# Patient Record
Sex: Female | Born: 1940 | Smoking: Never smoker
Health system: Southern US, Community
[De-identification: ages and names within clinical notes are randomized; demographics above are authoritative.]

## PROBLEM LIST (undated history)

## (undated) DIAGNOSIS — F329 Major depressive disorder, single episode, unspecified: Secondary | ICD-10-CM

## (undated) DIAGNOSIS — F32A Depression, unspecified: Secondary | ICD-10-CM

## (undated) DIAGNOSIS — K219 Gastro-esophageal reflux disease without esophagitis: Secondary | ICD-10-CM

---

## 2013-11-11 DIAGNOSIS — I10 Essential (primary) hypertension: Secondary | ICD-10-CM | POA: Insufficient documentation

## 2013-11-11 DIAGNOSIS — K209 Esophagitis, unspecified without bleeding: Secondary | ICD-10-CM

## 2017-01-05 DIAGNOSIS — I10 Essential (primary) hypertension: Secondary | ICD-10-CM | POA: Diagnosis not present

## 2017-01-05 DIAGNOSIS — F419 Anxiety disorder, unspecified: Secondary | ICD-10-CM | POA: Diagnosis not present

## 2017-01-05 DIAGNOSIS — J309 Allergic rhinitis, unspecified: Secondary | ICD-10-CM | POA: Diagnosis not present

## 2017-01-05 DIAGNOSIS — K219 Gastro-esophageal reflux disease without esophagitis: Secondary | ICD-10-CM | POA: Diagnosis not present

## 2017-01-05 DIAGNOSIS — E785 Hyperlipidemia, unspecified: Secondary | ICD-10-CM | POA: Diagnosis not present

## 2017-05-06 ENCOUNTER — Observation Stay (HOSPITAL_COMMUNITY)
Admission: EM | Admit: 2017-05-06 | Discharge: 2017-05-07 | Disposition: A | Discharge status: Home / Self Care | Payer: Medicare HMO | Attending: Internal Medicine | Admitting: Internal Medicine

## 2017-05-06 ENCOUNTER — Other Ambulatory Visit: Payer: Self-pay

## 2017-05-06 ENCOUNTER — Encounter (HOSPITAL_COMMUNITY): Payer: Self-pay | Admitting: Neurology

## 2017-05-06 ENCOUNTER — Inpatient Hospital Stay (HOSPITAL_COMMUNITY): Payer: Medicare HMO

## 2017-05-06 DIAGNOSIS — K219 Gastro-esophageal reflux disease without esophagitis: Secondary | ICD-10-CM

## 2017-05-06 DIAGNOSIS — R1013 Epigastric pain: Secondary | ICD-10-CM | POA: Insufficient documentation

## 2017-05-06 DIAGNOSIS — R0602 Shortness of breath: Secondary | ICD-10-CM | POA: Diagnosis not present

## 2017-05-06 DIAGNOSIS — Z79899 Other long term (current) drug therapy: Secondary | ICD-10-CM | POA: Insufficient documentation

## 2017-05-06 DIAGNOSIS — K922 Gastrointestinal hemorrhage, unspecified: Secondary | ICD-10-CM | POA: Diagnosis present

## 2017-05-06 DIAGNOSIS — K449 Diaphragmatic hernia without obstruction or gangrene: Secondary | ICD-10-CM | POA: Diagnosis not present

## 2017-05-06 DIAGNOSIS — K209 Esophagitis, unspecified without bleeding: Secondary | ICD-10-CM

## 2017-05-06 DIAGNOSIS — D509 Iron deficiency anemia, unspecified: Secondary | ICD-10-CM | POA: Diagnosis not present

## 2017-05-06 DIAGNOSIS — K2210 Ulcer of esophagus without bleeding: Secondary | ICD-10-CM | POA: Insufficient documentation

## 2017-05-06 DIAGNOSIS — K921 Melena: Secondary | ICD-10-CM | POA: Insufficient documentation

## 2017-05-06 DIAGNOSIS — Z23 Encounter for immunization: Secondary | ICD-10-CM | POA: Diagnosis not present

## 2017-05-06 DIAGNOSIS — K210 Gastro-esophageal reflux disease with esophagitis: Secondary | ICD-10-CM | POA: Insufficient documentation

## 2017-05-06 DIAGNOSIS — D649 Anemia, unspecified: Secondary | ICD-10-CM

## 2017-05-06 DIAGNOSIS — R1111 Vomiting without nausea: Secondary | ICD-10-CM | POA: Diagnosis not present

## 2017-05-06 DIAGNOSIS — R112 Nausea with vomiting, unspecified: Secondary | ICD-10-CM | POA: Diagnosis present

## 2017-05-06 DIAGNOSIS — D500 Iron deficiency anemia secondary to blood loss (chronic): Secondary | ICD-10-CM | POA: Diagnosis not present

## 2017-05-06 DIAGNOSIS — F329 Major depressive disorder, single episode, unspecified: Secondary | ICD-10-CM | POA: Insufficient documentation

## 2017-05-06 DIAGNOSIS — R197 Diarrhea, unspecified: Secondary | ICD-10-CM | POA: Diagnosis not present

## 2017-05-06 DIAGNOSIS — K920 Hematemesis: Principal | ICD-10-CM | POA: Insufficient documentation

## 2017-05-06 DIAGNOSIS — R531 Weakness: Secondary | ICD-10-CM | POA: Diagnosis not present

## 2017-05-06 DIAGNOSIS — R031 Nonspecific low blood-pressure reading: Secondary | ICD-10-CM | POA: Diagnosis not present

## 2017-05-06 DIAGNOSIS — R1110 Vomiting, unspecified: Secondary | ICD-10-CM | POA: Diagnosis not present

## 2017-05-06 HISTORY — DX: Major depressive disorder, single episode, unspecified: F32.9

## 2017-05-06 HISTORY — DX: Depression, unspecified: F32.A

## 2017-05-06 HISTORY — DX: Gastro-esophageal reflux disease without esophagitis: K21.9

## 2017-05-06 LAB — CBC
HCT: 28.5 % — ABNORMAL LOW (ref 36.0–46.0)
HCT: 25.5 % — ABNORMAL LOW (ref 36.0–46.0)
HEMATOCRIT: 15.4 % — AB (ref 36.0–46.0)
HEMATOCRIT: 23.6 % — AB (ref 36.0–46.0)
HEMOGLOBIN: 8.5 g/dL — AB (ref 12.0–15.0)
Hemoglobin: 4.3 g/dL — CL (ref 12.0–15.0)
Hemoglobin: 7.6 g/dL — ABNORMAL LOW (ref 12.0–15.0)
Hemoglobin: 9.3 g/dL — ABNORMAL LOW (ref 12.0–15.0)
MCH: 18.9 pg — ABNORMAL LOW (ref 26.0–34.0)
MCH: 24.8 pg — ABNORMAL LOW (ref 26.0–34.0)
MCH: 25.1 pg — AB (ref 26.0–34.0)
MCH: 25.8 pg — ABNORMAL LOW (ref 26.0–34.0)
MCHC: 27.9 g/dL — AB (ref 30.0–36.0)
MCHC: 32.2 g/dL (ref 30.0–36.0)
MCHC: 32.6 g/dL (ref 30.0–36.0)
MCHC: 33.3 g/dL (ref 30.0–36.0)
MCV: 67.8 fL — AB (ref 78.0–100.0)
MCV: 76.9 fL — ABNORMAL LOW (ref 78.0–100.0)
MCV: 76.8 fL — ABNORMAL LOW (ref 78.0–100.0)
MCV: 77.3 fL — ABNORMAL LOW (ref 78.0–100.0)
PLATELETS: 305 10*3/uL (ref 150–400)
PLATELETS: 193 10*3/uL (ref 150–400)
PLATELETS: 182 10*3/uL (ref 150–400)
PLATELETS: 192 10*3/uL (ref 150–400)
RBC: 2.27 MIL/uL — ABNORMAL LOW (ref 3.87–5.11)
RBC: 3.07 MIL/uL — ABNORMAL LOW (ref 3.87–5.11)
RBC: 3.71 MIL/uL — AB (ref 3.87–5.11)
RBC: 3.3 MIL/uL — AB (ref 3.87–5.11)
RDW: 20.1 % — AB (ref 11.5–15.5)
RDW: 20 % — AB (ref 11.5–15.5)
RDW: 19.4 % — ABNORMAL HIGH (ref 11.5–15.5)
RDW: 19.8 % — ABNORMAL HIGH (ref 11.5–15.5)
WBC: 9.1 10*3/uL (ref 4.0–10.5)
WBC: 6.9 10*3/uL (ref 4.0–10.5)
WBC: 9.4 10*3/uL (ref 4.0–10.5)
WBC: 10.2 10*3/uL (ref 4.0–10.5)

## 2017-05-06 LAB — URINALYSIS, ROUTINE W REFLEX MICROSCOPIC
BILIRUBIN URINE: NEGATIVE
GLUCOSE, UA: NEGATIVE mg/dL
KETONES UR: 5 mg/dL — AB
Leukocytes, UA: NEGATIVE
Nitrite: NEGATIVE
PROTEIN: NEGATIVE mg/dL
Specific Gravity, Urine: 1.02 (ref 1.005–1.030)
Squamous Epithelial / LPF: NONE SEEN
pH: 6 (ref 5.0–8.0)

## 2017-05-06 LAB — COMPREHENSIVE METABOLIC PANEL
ALBUMIN: 3.4 g/dL — AB (ref 3.5–5.0)
ALT: 17 U/L (ref 14–54)
AST: 28 U/L (ref 15–41)
Alkaline Phosphatase: 73 U/L (ref 38–126)
Anion gap: 13 (ref 5–15)
BILIRUBIN TOTAL: 0.7 mg/dL (ref 0.3–1.2)
BUN: 47 mg/dL — ABNORMAL HIGH (ref 6–20)
CHLORIDE: 110 mmol/L (ref 101–111)
CO2: 15 mmol/L — ABNORMAL LOW (ref 22–32)
CREATININE: 1.02 mg/dL — AB (ref 0.44–1.00)
Calcium: 8.7 mg/dL — ABNORMAL LOW (ref 8.9–10.3)
GFR calc Af Amer: >60 mL/min (ref >60)
GFR calc non Af Amer: 52 mL/min — ABNORMAL LOW (ref >60)
GLUCOSE: 114 mg/dL — AB (ref 65–99)
POTASSIUM: 4.1 mmol/L (ref 3.5–5.1)
Sodium: 138 mmol/L (ref 135–145)
TOTAL PROTEIN: 5.7 g/dL — AB (ref 6.5–8.1)

## 2017-05-06 LAB — I-STAT TROPONIN, ED: Troponin i, poc: 0.03 ng/mL (ref 0.00–0.08)

## 2017-05-06 LAB — PROTIME-INR
INR: 1.19
Prothrombin Time: 15 seconds (ref 11.4–15.2)

## 2017-05-06 LAB — FERRITIN: FERRITIN: 7 ng/mL — AB (ref 11–307)

## 2017-05-06 LAB — I-STAT CG4 LACTIC ACID, ED
LACTIC ACID, VENOUS: 5.65 mmol/L — AB (ref 0.5–1.9)
LACTIC ACID, VENOUS: 1.52 mmol/L (ref 0.5–1.9)

## 2017-05-06 LAB — PREPARE RBC (CROSSMATCH)

## 2017-05-06 LAB — POC OCCULT BLOOD, ED: Fecal Occult Bld: POSITIVE — AB

## 2017-05-06 LAB — ABO/RH: ABO/RH(D): A POS

## 2017-05-06 LAB — APTT: APTT: 24 s (ref 24–36)

## 2017-05-06 LAB — LIPASE, BLOOD: Lipase: 24 U/L (ref 11–51)

## 2017-05-06 MED ORDER — PANTOPRAZOLE SODIUM 40 MG IV SOLR
8.0000 mg/h | INTRAVENOUS | Status: DC
  Administered 2017-05-06 – 2017-05-07 (×3): 8 mg/h via INTRAVENOUS
  Filled 2017-05-06 (×5): qty 80

## 2017-05-06 MED ORDER — SODIUM CHLORIDE 0.9% FLUSH
3.0000 mL | Freq: Two times a day (BID) | INTRAVENOUS | Status: DC
  Administered 2017-05-06: 3 mL via INTRAVENOUS

## 2017-05-06 MED ORDER — PANTOPRAZOLE SODIUM 40 MG IV SOLR
40.0000 mg | Freq: Once | INTRAVENOUS | Status: AC
  Administered 2017-05-06: 40 mg via INTRAVENOUS
  Filled 2017-05-06: qty 40

## 2017-05-06 MED ORDER — SODIUM CHLORIDE 0.9 % IV SOLN
INTRAVENOUS | Status: DC
  Administered 2017-05-06: 11:00:00 via INTRAVENOUS

## 2017-05-06 MED ORDER — PANTOPRAZOLE SODIUM 40 MG IV SOLR
40.0000 mg | Freq: Two times a day (BID) | INTRAVENOUS | Status: DC
Start: 2017-05-09 — End: 2017-05-07

## 2017-05-06 MED ORDER — VENLAFAXINE HCL ER 37.5 MG PO CP24
37.5000 mg | ORAL_CAPSULE | Freq: Every day | ORAL | Status: DC
  Administered 2017-05-06 – 2017-05-07 (×2): 37.5 mg via ORAL
  Filled 2017-05-06 (×2): qty 1

## 2017-05-06 MED ORDER — SODIUM CHLORIDE 0.9 % IV BOLUS (SEPSIS)
1000.0000 mL | Freq: Once | INTRAVENOUS | Status: DC
  Administered 2017-05-06: 1000 mL via INTRAVENOUS

## 2017-05-06 MED ORDER — INFLUENZA VAC SPLIT HIGH-DOSE 0.5 ML IM SUSY
0.5000 mL | PREFILLED_SYRINGE | INTRAMUSCULAR | Status: AC
  Administered 2017-05-07: 0.5 mL via INTRAMUSCULAR
  Filled 2017-05-06: qty 0.5

## 2017-05-06 MED ORDER — IOPAMIDOL (ISOVUE-300) INJECTION 61%
INTRAVENOUS | Status: AC
Start: 2017-05-06 — End: 2017-05-06
  Administered 2017-05-06: 100 mL
  Filled 2017-05-06: qty 100

## 2017-05-06 MED ORDER — SODIUM CHLORIDE 0.9 % IV SOLN: Freq: Once | INTRAVENOUS | Status: DC

## 2017-05-06 NOTE — ED Notes (Signed)
Blood consent obtained in Epic. Signed by her husband.

## 2017-05-06 NOTE — ED Notes (Signed)
Paged admitting for bp. Admitting ok with bp for now, just to monitor pt make sure SP> 190

## 2017-05-06 NOTE — ED Provider Notes (Signed)
[facility] Provider Note   CSN: [id number] Arrival date & time: 05/06/17  0741     History   Chief Complaint Chief Complaint  Patient presents with  . Nausea  . Emesis    HPI Carolyn Elliott is a 76 y.o. female.  HPI Patient has no significant past medical history.  She does report history of GERD.  She denies a prior history of GI bleed.  Patient developed vomiting last night.  She reports one large episode of black brown vomitus.  She reports also having a large episode of diarrheal stool that was black in appearance.  She is currently denying pain but reports that she does get epigastric pain which she attributes to reflux.  Patient denies any prior diagnosed GI history.  She reports she was not having vomiting or diarrhea before yesterday evening.  She reports that she is able to eat without having pain and does not have vomiting after eating.  Reports by this morning however she gotten so weak that she felt like she might pass out.  Patient's husband reports that she frequently has problems with leg cramps and particularly had a lot of pain and leg cramps last night.  She does not get swelling.  On review of systems patient did endorse exertional shortness of breath.  She reports if she climbs stairs or has to lift things she starts to get short of breath, she notes this is been developing for about a month.  No known cardiac or pulmonary history. Past Medical History:  Diagnosis Date  . Depression   . GERD (gastroesophageal reflux disease)     Patient Active Problem List   Diagnosis Date Noted  . Acute upper GI bleed 05/06/2017  . GERD (gastroesophageal reflux disease) 05/06/2017    History reviewed. No pertinent surgical history.  OB History    No data available       Home Medications    Prior to Admission medications   Medication Sig Start Date End Date Taking? Authorizing Provider  fexofenadine (ALLEGRA) 180 MG tablet Take  180 mg daily as needed by mouth for allergies.   Yes [provider]  venlafaxine XR (EFFEXOR-XR) 37.5 MG 24 hr capsule Take 37.5 mg daily by mouth. 04/02/17  Yes [provider]    Family History Family History  Problem Relation Age of Onset  . Hypertension Mother   . Throat cancer Maternal Grandfather   . Stomach cancer Maternal Aunt     Social History Social History   Tobacco Use  . Smoking status: Never Smoker  . Smokeless tobacco: Never Used  Substance Use Topics  . Alcohol use: Yes    Frequency: Never    Comment: Occasional glass of wine  . Drug use: No     Allergies   Patient has no known allergies.   Review of Systems Review of Systems 10 Systems reviewed and are negative for acute change except as noted in the HPI.   Physical Exam Updated Vital Signs BP (!) 152/71   Pulse 85   Temp 98.6 F (37 C)   Resp 20   Ht 5\' 2"  (1.575 m)   Wt 63.5 kg (140 lb)   SpO2 98%   BMI 25.61 kg/m   Physical Exam  Constitutional: She is oriented to person, place, and time. She appears well-developed and well-nourished.  Patient appears pale and ill.  She is alert and appropriate with no respiratory distress.  Aside from hypothermia patient  has normal sinus rhythm on monitor in the 70s and normotensive blood pressure.  HENT:  Head: Normocephalic and atraumatic.  Mouth/Throat: Oropharynx is clear and moist.  Eyes: Conjunctivae and EOM are normal.  Neck: Neck supple.  Cardiovascular: Normal rate and regular rhythm.  No murmur heard. Pulmonary/Chest: Effort normal and breath sounds normal. No respiratory distress.  Abdominal: Soft. She exhibits no distension. There is no tenderness.  Genitourinary:  Genitourinary Comments: Patient has black dried stool around rectal area.  Musculoskeletal: Normal range of motion. She exhibits no edema or tenderness.  No Peripheral edema no calf tenderness.  Neurological: She is alert and oriented to person, place, and  time. No cranial nerve deficit. She exhibits normal muscle tone. Coordination normal.  Skin: Skin is warm and dry.  Psychiatric: She has a normal mood and affect.  Nursing note and vitals reviewed.    ED Treatments / Results  Labs (all labs ordered are listed, but only abnormal results are displayed) Labs Reviewed  COMPREHENSIVE METABOLIC PANEL - Abnormal; Notable for the following components:      Result Value   CO2 15 (*)    Glucose, Bld 114 (*)    BUN 47 (*)    Creatinine, Ser 1.02 (*)    Calcium 8.7 (*)    Total Protein 5.7 (*)    Albumin 3.4 (*)    GFR calc non Af Amer 52 (*)    All other components within normal limits  CBC - Abnormal; Notable for the following components:   RBC 2.27 (*)    Hemoglobin 4.3 (*)    HCT 15.4 (*)    MCV 67.8 (*)    MCH 18.9 (*)    MCHC 27.9 (*)    RDW 20.1 (*)    All other components within normal limits  I-STAT CG4 LACTIC ACID, ED - Abnormal; Notable for the following components:   Lactic Acid, Venous 5.65 (*)    All other components within normal limits  POC OCCULT BLOOD, ED - Abnormal; Notable for the following components:   Fecal Occult Bld POSITIVE (*)    All other components within normal limits  CULTURE, BLOOD (ROUTINE X 2)  CULTURE, BLOOD (ROUTINE X 2)  LIPASE, BLOOD  PROTIME-INR  APTT  URINALYSIS, ROUTINE W REFLEX MICROSCOPIC  CBC  CBC  CBC  FERRITIN  I-STAT TROPONIN, ED  I-STAT CG4 LACTIC ACID, ED  TYPE AND SCREEN  PREPARE RBC (CROSSMATCH)  ABO/RH    EKG  EKG Interpretation  Date/Time:  Wednesday May 06 2017 08:26:10 EST Ventricular Rate:  71 PR Interval:    QRS Duration: 97 QT Interval:  424 QTC Calculation: 461 R Axis:   63 Text Interpretation:  Sinus rhythm Nonspecific repol abnormality, diffuse leads agree. no STEMI. NO OLD COMPARISON Confirmed by Arby BarrettePfeiffer, Jaziyah Gradel [phone] on 05/06/2017 9:08:15 AM       Radiology No results found.  Procedures Procedures (including critical care time) CRITICAL  CARE Performed by: Arby BarrettePfeiffer, Kayci [place]   Total critical care time:30 minutes  Critical care time was exclusive of separately billable procedures and treating other patients.  Critical care was necessary to treat or prevent imminent or life-threatening deterioration.  Critical care was time spent personally by me on the following activities: development of treatment plan with patient and/or surrogate as well as nursing, discussions with consultants, evaluation of patient's response to treatment, examination of patient, obtaining history from patient or surrogate, ordering and performing treatments and interventions, ordering and review of laboratory studies, ordering and  review of radiographic studies, pulse oximetry and re-evaluation of patient's condition. Medications Ordered in ED Medications  0.9 %  sodium chloride infusion (not administered)  iopamidol (ISOVUE-300) 61 % injection (not administered)  pantoprazole (PROTONIX) 80 mg in sodium chloride 0.9 % 250 mL (0.32 mg/mL) infusion (8 mg/hr Intravenous New Bag/Given 05/06/17 1113)  pantoprazole (PROTONIX) injection 40 mg (not administered)  sodium chloride flush (NS) 0.9 % injection 3 mL (3 mLs Intravenous Not Given 05/06/17 1117)  0.9 %  sodium chloride infusion ( Intravenous New Bag/Given 05/06/17 1108)  venlafaxine XR (EFFEXOR-XR) 24 hr capsule 37.5 mg (not administered)  pantoprazole (PROTONIX) injection 40 mg (40 mg Intravenous Given 05/06/17 0841)  pantoprazole (PROTONIX) injection 40 mg (40 mg Intravenous Given 05/06/17 1004)     Initial Impression / Assessment and Plan / ED Course  I have reviewed the triage vital signs and the nursing notes.  Pertinent labs & imaging results that were available during my care of the patient were reviewed by me and considered in my medical decision making (see chart for details).  Clinical Course as of May 06 1226  Wed May 06, 2017  1610 Consult reviewed with internal medicine teaching service.  We  will seen in the emergency department for admission.  [MP]  1032 Consult: Reviewed with Dr. Nancy Marus of family practice.  She will schedule the patient for recheck tomorrow.  We will have the patient use her home oxygen regularly, continue her scheduled duo nebs  [MP]    Clinical Course User Index [MP] Arby Barrette, MD      Final Clinical Impressions(s) / ED Diagnoses   Final diagnoses:  Upper GI bleed  Symptomatic anemia   Patient presents as outlined above with near syncope and both emesis and bowel movement with dark bloody material.  Hemoglobin found to be 4.3.  2 units of blood transfused rapidly.  Patient tolerated well.  Consultation placed to gastroenterology for GI bleed as source of anemia.  Medical admission ED Discharge Orders    None       Arby Barrette, MD 05/06/17 1229

## 2017-05-06 NOTE — ED Notes (Signed)
Temp increased after 1 L heated blood, being on bear hugger. Removed and warm blankets applied.

## 2017-05-06 NOTE — ED Notes (Addendum)
Dr. Donnald GarrePfeiffer reports to wait on CT scan until patient receives blood. 1st unit is infusing. Admitting MD at bedside.

## 2017-05-06 NOTE — H&P (View-Only) (Signed)
Referring Provider:  Dr. Pfeiffer Primary Care Physician:  Patient, No Pcp Per Primary Gastroenterologist:  Unassigned  Reason for Consultation:  GI bleed/coffee-ground emesis  HPI: Carolyn Elliott is a 76 y.o. female with past medical history of GERD presented to the ED with coffee-ground emesis. She was found to have a hemoglobin of 4.3 on initial evaluation. Normal LFTs. GI is consulted for further evaluation.  Patient seen and examined while in the ED. Husband at bedside. She was noticing intermittent black stool for last 1 month which she thought could be from stomach virus. She also started noticing weakness and fatigue in last few weeks. Yesterday she had 1 episode of coffee-ground emesis and decided to come to the hospital. She is also complaining of mild epigastric discomfort which she attributed to her acid reflux. Denied any NSAID use. Denied any bright red blood per rectum. Last bowel movement was yesterday.  Family history of esophageal cancer in brother at age 59. No previous EGD or colonoscopy.  Past Medical History:  Diagnosis Date  . Depression   . GERD (gastroesophageal reflux disease)     History reviewed. No pertinent surgical history.  Prior to Admission medications   Medication Sig Start Date End Date Taking? Authorizing Provider  venlafaxine XR (EFFEXOR-XR) 37.5 MG 24 hr capsule Take 37.5 mg daily by mouth. 04/02/17   [provider]    Scheduled Meds: . iopamidol      . [START ON 05/09/2017] pantoprazole  40 mg Intravenous Q12H  . sodium chloride flush  3 mL Intravenous Q12H   Continuous Infusions: . sodium chloride    . pantoprozole (PROTONIX) infusion     PRN Meds:.  Allergies as of 05/06/2017  . (No Known Allergies)    Family History  Problem Relation Age of Onset  . Hypertension Mother   . Throat cancer Maternal Grandfather   . Stomach cancer Maternal Aunt     Social History   Socioeconomic History  . Marital status: Unknown     Spouse name: Not on file  . Number of children: Not on file  . Years of education: Not on file  . Highest education level: Not on file  Social Needs  . Financial resource strain: Not on file  . Food insecurity - worry: Not on file  . Food insecurity - inability: Not on file  . Transportation needs - medical: Not on file  . Transportation needs - non-medical: Not on file  Occupational History  . Not on file  Tobacco Use  . Smoking status: Never Smoker  . Smokeless tobacco: Never Used  Substance and Sexual Activity  . Alcohol use: Yes    Frequency: Never    Comment: Occasional glass of wine  . Drug use: No  . Sexual activity: Not on file  Other Topics Concern  . Not on file  Social History Narrative  . Not on file    Review of Systems: Review of Systems  Constitutional: Positive for malaise/fatigue. Negative for chills and fever.  HENT: Negative for hearing loss and tinnitus.   Eyes: Negative for blurred vision and double vision.  Respiratory: Positive for shortness of breath. Negative for cough and hemoptysis.   Cardiovascular: Negative for chest pain and palpitations.  Gastrointestinal: Positive for abdominal pain, heartburn, melena, nausea and vomiting.  Genitourinary: Negative for dysuria and urgency.  Musculoskeletal: Negative for myalgias and neck pain.  Skin: Negative for itching and rash.  Neurological: Positive for weakness. Negative for focal weakness and seizures.    Endo/Heme/Allergies: Does not bruise/bleed easily.  Psychiatric/Behavioral: Negative for hallucinations and substance abuse.    Physical Exam: Vital signs: Vitals:   05/06/17 0936 05/06/17 0956  BP: (!) 141/62 135/67  Pulse: 88 84  Resp: (!) 22 18  Temp: 97.8 F (36.6 C) 97.9 F (36.6 C)  SpO2: 100% 100%     Physical Exam  Constitutional: She is oriented to person, place, and time. She appears well-developed and well-nourished. No distress.  HENT:  Head: Normocephalic and atraumatic.   Mouth/Throat: Oropharynx is clear and moist. No oropharyngeal exudate.  Eyes: EOM are normal. Right eye exhibits no discharge. No scleral icterus.  Neck: Normal range of motion. Neck supple. No thyromegaly present.  Cardiovascular: Normal rate, regular rhythm and normal heart sounds.  Pulmonary/Chest: Effort normal and breath sounds normal. No respiratory distress.  Abdominal: Soft. Bowel sounds are normal. She exhibits no distension and no mass. There is no tenderness. There is no guarding.  Musculoskeletal: Normal range of motion. She exhibits no edema.  Neurological: She is alert and oriented to person, place, and time.  Skin: Skin is warm. No erythema.  Psychiatric: She has a normal mood and affect. Her behavior is normal. Judgment normal.  Nursing note and vitals reviewed.   GI:  Lab Results: Recent Labs    05/06/17 0800  WBC 9.1  HGB 4.3*  HCT 15.4*  PLT 305   BMET Recent Labs    05/06/17 0800  NA 138  K 4.1  CL 110  CO2 15*  GLUCOSE 114*  BUN 47*  CREATININE 1.02*  CALCIUM 8.7*   LFT Recent Labs    05/06/17 0800  PROT 5.7*  ALBUMIN 3.4*  AST 28  ALT 17  ALKPHOS 73  BILITOT 0.7   PT/INR No results for input(s): LABPROT, INR in the last 72 hours.   Studies/Results: No results found.  Impression/Plan: - Coffee-ground emesis. - Melena. Intermittent for last 1 month. - Acute blood loss anemia with hemoglobin of 4.3 on admission. - Chronic GERD - Family history of esophageal cancer in brother at age for 4259  Recommendations -------------------------- - Transfuse to keep hemoglobin around 7-8 - Continue Protonix drip for now. - Follow CT abdomen pelvis which is ordered by ER physician  - Plan for EGD tomorrow depending on patient's hemoglobin level.  Risks (bleeding, infection, bowel perforation that could require surgery, sedation-related changes in cardiopulmonary systems), benefits (identification and possible treatment of source of symptoms,  exclusion of certain causes of symptoms), and alternatives (watchful waiting, radiographic imaging studies, empiric medical treatment)  were explained to patient and husband in detail and patient wishes to proceed.  - Okay to have full liquid diet from GI standpoint after CT scan. Nothing by mouth past midnight.   LOS: 0 days   Kathi DerParag Shaquon Gropp  MD, FACP 05/06/2017, 10:08 AM  Contact #  [phone]

## 2017-05-06 NOTE — Consult Note (Signed)
Referring Provider:  Dr. Donnald GarrePfeiffer Primary Care Physician:  Patient, No Pcp Per Primary Gastroenterologist:  Gentry FitzUnassigned  Reason for Consultation:  GI bleed/coffee-ground emesis  HPI: Carolyn KayserMartha J Stockman is a 76 y.o. female with past medical history of GERD presented to the ED with coffee-ground emesis. She was found to have a hemoglobin of 4.3 on initial evaluation. Normal LFTs. GI is consulted for further evaluation.  Patient seen and examined while in the ED. Husband at bedside. She was noticing intermittent black stool for last 1 month which she thought could be from stomach virus. She also started noticing weakness and fatigue in last few weeks. Yesterday she had 1 episode of coffee-ground emesis and decided to come to the hospital. She is also complaining of mild epigastric discomfort which she attributed to her acid reflux. Denied any NSAID use. Denied any bright red blood per rectum. Last bowel movement was yesterday.  Family history of esophageal cancer in brother at age 75. No previous EGD or colonoscopy.  Past Medical History:  Diagnosis Date  . Depression   . GERD (gastroesophageal reflux disease)     History reviewed. No pertinent surgical history.  Prior to Admission medications   Medication Sig Start Date End Date Taking? Authorizing Provider  venlafaxine XR (EFFEXOR-XR) 37.5 MG 24 hr capsule Take 37.5 mg daily by mouth. 04/02/17   [provider]    Scheduled Meds: . iopamidol      . [START ON 05/09/2017] pantoprazole  40 mg Intravenous Q12H  . sodium chloride flush  3 mL Intravenous Q12H   Continuous Infusions: . sodium chloride    . pantoprozole (PROTONIX) infusion     PRN Meds:.  Allergies as of 05/06/2017  . (No Known Allergies)    Family History  Problem Relation Age of Onset  . Hypertension Mother   . Throat cancer Maternal Grandfather   . Stomach cancer Maternal Aunt     Social History   Socioeconomic History  . Marital status: Unknown     Spouse name: Not on file  . Number of children: Not on file  . Years of education: Not on file  . Highest education level: Not on file  Social Needs  . Financial resource strain: Not on file  . Food insecurity - worry: Not on file  . Food insecurity - inability: Not on file  . Transportation needs - medical: Not on file  . Transportation needs - non-medical: Not on file  Occupational History  . Not on file  Tobacco Use  . Smoking status: Never Smoker  . Smokeless tobacco: Never Used  Substance and Sexual Activity  . Alcohol use: Yes    Frequency: Never    Comment: Occasional glass of wine  . Drug use: No  . Sexual activity: Not on file  Other Topics Concern  . Not on file  Social History Narrative  . Not on file    Review of Systems: Review of Systems  Constitutional: Positive for malaise/fatigue. Negative for chills and fever.  HENT: Negative for hearing loss and tinnitus.   Eyes: Negative for blurred vision and double vision.  Respiratory: Positive for shortness of breath. Negative for cough and hemoptysis.   Cardiovascular: Negative for chest pain and palpitations.  Gastrointestinal: Positive for abdominal pain, heartburn, melena, nausea and vomiting.  Genitourinary: Negative for dysuria and urgency.  Musculoskeletal: Negative for myalgias and neck pain.  Skin: Negative for itching and rash.  Neurological: Positive for weakness. Negative for focal weakness and seizures.  Endo/Heme/Allergies: Does not bruise/bleed easily.  Psychiatric/Behavioral: Negative for hallucinations and substance abuse.    Physical Exam: Vital signs: Vitals:   05/06/17 0936 05/06/17 0956  BP: (!) 141/62 135/67  Pulse: 88 84  Resp: (!) 22 18  Temp: 97.8 F (36.6 C) 97.9 F (36.6 C)  SpO2: 100% 100%     Physical Exam  Constitutional: She is oriented to person, place, and time. She appears well-developed and well-nourished. No distress.  HENT:  Head: Normocephalic and atraumatic.   Mouth/Throat: Oropharynx is clear and moist. No oropharyngeal exudate.  Eyes: EOM are normal. Right eye exhibits no discharge. No scleral icterus.  Neck: Normal range of motion. Neck supple. No thyromegaly present.  Cardiovascular: Normal rate, regular rhythm and normal heart sounds.  Pulmonary/Chest: Effort normal and breath sounds normal. No respiratory distress.  Abdominal: Soft. Bowel sounds are normal. She exhibits no distension and no mass. There is no tenderness. There is no guarding.  Musculoskeletal: Normal range of motion. She exhibits no edema.  Neurological: She is alert and oriented to person, place, and time.  Skin: Skin is warm. No erythema.  Psychiatric: She has a normal mood and affect. Her behavior is normal. Judgment normal.  Nursing note and vitals reviewed.   GI:  Lab Results: Recent Labs    05/06/17 0800  WBC 9.1  HGB 4.3*  HCT 15.4*  PLT 305   BMET Recent Labs    05/06/17 0800  NA 138  K 4.1  CL 110  CO2 15*  GLUCOSE 114*  BUN 47*  CREATININE 1.02*  CALCIUM 8.7*   LFT Recent Labs    05/06/17 0800  PROT 5.7*  ALBUMIN 3.4*  AST 28  ALT 17  ALKPHOS 73  BILITOT 0.7   PT/INR No results for input(s): LABPROT, INR in the last 72 hours.   Studies/Results: No results found.  Impression/Plan: - Coffee-ground emesis. - Melena. Intermittent for last 1 month. - Acute blood loss anemia with hemoglobin of 4.3 on admission. - Chronic GERD - Family history of esophageal cancer in brother at age for 4259  Recommendations -------------------------- - Transfuse to keep hemoglobin around 7-8 - Continue Protonix drip for now. - Follow CT abdomen pelvis which is ordered by ER physician  - Plan for EGD tomorrow depending on patient's hemoglobin level.  Risks (bleeding, infection, bowel perforation that could require surgery, sedation-related changes in cardiopulmonary systems), benefits (identification and possible treatment of source of symptoms,  exclusion of certain causes of symptoms), and alternatives (watchful waiting, radiographic imaging studies, empiric medical treatment)  were explained to patient and husband in detail and patient wishes to proceed.  - Okay to have full liquid diet from GI standpoint after CT scan. Nothing by mouth past midnight.   LOS: 0 days   Kathi DerParag Ketina Mars  MD, FACP 05/06/2017, 10:08 AM  Contact #  [phone]

## 2017-05-06 NOTE — ED Notes (Signed)
Went to CT at 13:53.

## 2017-05-06 NOTE — ED Triage Notes (Signed)
Per ems-n/v/d x several days. Was sitting on the toilet when EMS arrived, slumped over toilet, weak radials.  114/36 initial BP. Given 300 NS given. Pt is a x 4. CBG 247. IV 20 gauge L. AC.

## 2017-05-06 NOTE — H&P (Signed)
Date: 05/06/2017               Patient Name:  Carolyn KayserMartha J Elliott MRN: [id number]  DOB: 1941/06/07 Age / Sex: 76 y.o., female   PCP: Patient, No Pcp Per         Medical Service: Internal Medicine Teaching Service         Attending Physician: Dr. Rogelia BogaButcher, Austin MilesElizabeth A, MD    First Contact: Dr. Caron PresumeHelberg Pager: [phone]  Second Contact: Dr. Antony ContrasGuilloud Pager: [phone]       After Hours (After 5p/  First Contact Pager: [phone]  weekends / holidays): Second Contact Pager: [phone]   Chief Complaint: Dark stool, weakness, nausea and vomiting  History of Present Illness:  Ms. Edwyna ReadyMartha Elliott is a 76 year old woman with PMH significant for GERD, HTN (no longer on antihypertensives), HLD, and Anxiety/Depression who presents for evaluation of nausea, vomiting, dark stool, and weakness. She is accompanied by her husband.  She reports symptoms of intermittent black colored diarrhea beginning about 1 month ago. She has had associated shortness of breath with exertion and weakness in her legs. Last night, her symptoms worsened and she became nauseous and had an episode of dark black colored emesis with another large dark black diarrheal bowel movement. She felt lightheaded, diaphoretic, and as if she was having hot flashes. She did not lose consciousness, fall, or injure herself. She reports having epigastric pain which feels like her usual reflux/heartburn that worsened as she has not taken her Nexium recently. Her husband thinks she appears more pale than usual since last night. She denies any similar symptoms in the past prior to 1 month ago. She denies any dysphagia or odynophagia. She has not had a colonoscopy. She does not routinely seek medical care, but reports seeing a primary care physician a few months ago with [facility].  She says the only medications she takes are Nexium for GERD, Allegra for pruritus, and Effexor for anxiety/depression. She denies any NSAID use.  In the ED, initial vitals were: BP  126/49   Pulse 84   Temp 94 F (34.4 C) (Rectal)   Resp 20   Ht 5\' 2"  (1.575 m)   Wt 63.5 kg (140 lb)   SpO2 100%  Initial lab work was notable for a Hgb of 4.3 (last value 11.5 in 2016 in Care Everywhere). She was typed and screened and ordered to have PRBC transfusions. She received 1 L NS bolus, and started on a Protonix drip. A warming blanket was placed on her with improvement in her temp to 98.3. GI were consulted for further evaluation and management and IMTS were called for admission.   Social Hx/Family Hx: She denies any history of tobacco use. She drinks an occasional glass of wine and denies any illicit drug use. She reports a history of throat cancer in her maternal grandfather and a history of stomach cancer in her maternal aunt.  Meds:  Current Meds  Medication Sig  . fexofenadine (ALLEGRA) 180 MG tablet Take 180 mg daily as needed by mouth for allergies.  Marland Kitchen. venlafaxine XR (EFFEXOR-XR) 37.5 MG 24 hr capsule Take 37.5 mg daily by mouth.     Allergies: Allergies as of 05/06/2017  . (No Known Allergies)   Past Medical History:  Diagnosis Date  . Depression   . GERD (gastroesophageal reflux disease)     Review of Systems: A complete ROS was negative except as per HPI.   Physical Exam: Blood pressure 138/65, pulse  80, temperature 99 F (37.2 C), resp. rate 18, height 5\' 2"  (1.575 m), weight 140 lb (63.5 kg), SpO2 100 %. Physical Exam  Constitutional: She is oriented to person, place, and time. She appears well-developed. No distress.  Resting in bed under warming blanket, appears pale, not diaphoretic or in acute distress. Answering all questions appropriately.  HENT:  Head: Normocephalic and atraumatic.  Mouth/Throat: No oropharyngeal exudate.  Dry tongue  Eyes:  Slight conjunctival pallor  Neck: Neck supple.  Cardiovascular: Normal rate and regular rhythm.  No murmur heard. Pulmonary/Chest: Effort normal. No stridor. No respiratory distress. She has no  wheezes.  Abdominal: Soft. She exhibits no distension. There is no tenderness. There is no guarding.  Musculoskeletal: She exhibits no edema or tenderness.  Neurological: She is alert and oriented to person, place, and time.  Skin: Skin is warm. Capillary refill takes 2 to 3 seconds. She is not diaphoretic.  Psychiatric: She has a normal mood and affect.     EKG: personally reviewed my interpretation is sinus rhythm, rate 72 bpm, q-waves inferior leads  CXR: not obtained  Assessment & Plan by Problem: Principal Problem:   Acute upper GI bleed Active Problems:   GERD (gastroesophageal reflux disease)  76 year old woman with PMH significant for GERD, HTN (no longer on antihypertensives), HLD, and Anxiety/Depression who presents with symptomatic anemia from suspected upper GI bleed.  Symptomatic Anemia 2/2 Upper GI Bleed: Patient with coffee-ground emesis last night and melena for the last month. She had significant symptomatic anemia on arrival with a Hgb of 4.3 and hypothermia. She responded well to warming blankets and a fluid bolus. She has a longstanding history of GERD without prior EGD or colonoscopy. Will need an upper endoscopy to evaluate for source of her bleeding. Leading differential includes PUD and upper GI malignancy. Doubt Mallory-Weiss as she has only had one episode of emesis and doubt esophageal rupture as she has no subcutaneous crepitus, hamman's sign, and has responded well to initial therapy. CT Abdomen/Pelvis was ordered and GI has been consulted by the ED physician. - Admit to SDU - Transfuse 4 units PRBCs now - Monitor CBC post-transfusion and q4h; Transfuse for Hgb <7.0 - NS @ 100/hr - Continue Protonix gtt for now per GI; can transition to BID IV PPI dosing - Continue warming blanket as needed - f/u CT abd/pelvis - NPO @ MN for EGD tomorrow - Appreciate GI consultation - Strict I/Os  Code Status: DNR/DNI confirmed with patient; otherwise full scope of  practice.    Dispo: Admit patient to Inpatient with expected length of stay greater than 2 midnights.  Signed: Darreld McleanPatel, Javeria Briski, MD 05/06/2017, 11:27 AM

## 2017-05-06 NOTE — Progress Notes (Signed)
Subjective: Patient reports a decrease in symptoms after receiving 4 units of rbcs. Daughter and granddaughter present during interview. Denies any further nausea, vomiting, or bloody diarrhea. Complains of dry mouth. Patient was hoping to go home today but is willing to stay and understands the importance of getting upper endoscopy in the morning. All questions and concerns were addressed.  Objective: Vital signs in last 24 hours: Vitals:   05/06/17 1217 05/06/17 1323 05/06/17 1419 05/06/17 1441  BP: (!) 152/71 (!) 148/59 (!) 151/70 (!) 159/72  Pulse: 85 80 87 83  Resp: 20 18 16    Temp: 98.6 F (37 C) 98.7 F (37.1 C) 99 F (37.2 C) 98.8 F (37.1 C)  TempSrc:  Oral Oral   SpO2:   99%   Weight:      Height:       Weight change:   Intake/Output Summary (Last 24 hours) at 05/06/2017 1532 Last data filed at 05/06/2017 1441 Gross per 24 hour  Intake 1729 ml  Output -  Net 1729 ml   Physical Exam  General: laying comfortably in bed receiving transfusion, no acute distress  HENT Eyes: conjunctival pallor Throat: dry mucus membranes Chest: RRR,normal heart sounds, no JVD, no m/r/g Lungs: CTAB, normal work of breathing, no crackles no wheezing Abd: soft, non tender, non distended Neuro: alert and oriented x4 Skin: skin is warm, no rashes, no edema, 3-4second capillary refill  Lab Results: @LABTEST2 @ Micro Results: No results found for this or any previous visit (from the past 240 hour(s)). Studies/Results: Ct Abdomen Pelvis W Contrast  Result Date: 05/06/2017 CLINICAL DATA:  Vomiting, diarrhea for months. Stool positive, blood transfusion today. EXAM: CT ABDOMEN AND PELVIS WITH CONTRAST TECHNIQUE: Multidetector CT imaging of the abdomen and pelvis was performed using the standard protocol following bolus administration of intravenous contrast. CONTRAST:  100mL ISOVUE-300 IOPAMIDOL (ISOVUE-300) INJECTION 61% COMPARISON:  None. FINDINGS: Lower chest: Lung bases are clear. Large  hiatal hernia, partially imaged. Hepatobiliary: No focal liver abnormality is seen. No gallstones, gallbladder wall thickening, or biliary dilatation. Pancreas: Unremarkable. No pancreatic ductal dilatation or surrounding inflammatory changes. Spleen: Normal in size without focal abnormality. Adrenals/Urinary Tract: Right renal cortex scarring. No suspicious mass or lesion within either kidney. No renal stone or hydronephrosis. No ureteral or bladder calculi identified. Bladder appears normal. Adrenal glands are unremarkable. Stomach/Bowel: Bowel is normal in caliber. No bowel wall thickening or evidence of bowel wall inflammation. Scattered diverticulosis within the descending and sigmoid colon without evidence of acute diverticulitis. Appendix is normal. Vascular/Lymphatic: Aortic atherosclerosis. No enlarged abdominal or pelvic lymph nodes. Reproductive: Uterus and bilateral adnexa are unremarkable. Other: No free fluid or abscess collection. No free intraperitoneal air. Musculoskeletal: Degenerative change within the scoliotic lumbar spine, mild to moderate in degree. No acute or suspicious osseous finding. IMPRESSION: 1. No acute findings. No bowel obstruction or evidence of bowel wall inflammation. No free fluid or hemorrhage. No evidence of acute solid organ abnormality. 2. Colonic diverticulosis without evidence of acute diverticulitis. 3. Large hiatal hernia, incompletely imaged. 4. Aortic atherosclerosis. Electronically Signed   By: Bary RichardStan  Maynard M.D.   On: 05/06/2017 14:20   Medications: I have reviewed the patient's current medications. Scheduled Meds: . [START ON 05/09/2017] pantoprazole  40 mg Intravenous Q12H  . sodium chloride flush  3 mL Intravenous Q12H  . venlafaxine XR  37.5 mg Oral Daily   Continuous Infusions: . sodium chloride    . sodium chloride 100 mL/hr at 05/06/17 1108  . pantoprozole (PROTONIX) infusion  8 mg/hr (05/06/17 1113)   PRN Meds:. Assessment/Plan: Principal  Problem:   Acute upper GI bleed Active Problems:   GERD (gastroesophageal reflux disease)  76 year old woman with a PMHx of GERD, HTN, HLD, anxiety/depression who presents with symptomatic anemia from signs of upper gastointestinal bleeding. Patient is not taking NSAIDs, drinks alcohol only a rare occasion, and denies coughing or vomiting other than 1 episode yesterday. Hematemesis and melena indicate a likely upper GI bleed. PUD vs malignancy highest on my differential with mallory weiss, gastritis, and boerhaave less likely. Has responded well to 4 units of RBCs and warm blankets  Anemia 2/2 suspected upper GI bleeding -repeat CBC post transfusion -f/u CT abd/pelvis -4 units RBCs, keep hgb 7-8 -EGD in the morning -Protonix gtt -full liquid diet, NPO after midnight  This is a Psychologist, occupationalMedical Student Note.  The care of the patient was discussed with Dr. Caron PresumeHelberg and the assessment and plan formulated with their assistance.  Please see their attached note for official documentation of the daily encounter.   LOS: 0 days   Lisette GrinderMcMillian, Alyan Hartline A, Medical Student 05/06/2017, 3:32 PM My pager: [phone]

## 2017-05-06 NOTE — Anesthesia Preprocedure Evaluation (Addendum)
Anesthesia Evaluation  Patient identified by MRN, date of birth, ID band Patient awake    Reviewed: Allergy & Precautions, NPO status , Patient's Chart, lab work & pertinent test results  History of Anesthesia Complications Negative for: history of anesthetic complications  Airway Mallampati: II  TM Distance: >3 FB Neck ROM: Full    Dental no notable dental hx. (+) Dental Advisory Given   Pulmonary neg pulmonary ROS,    Pulmonary exam normal        Cardiovascular negative cardio ROS Normal cardiovascular exam     Neuro/Psych PSYCHIATRIC DISORDERS Depression    GI/Hepatic Neg liver ROS, GERD  Medicated,GI bleed   Endo/Other  negative endocrine ROS  Renal/GU negative Renal ROS     Musculoskeletal   Abdominal   Peds  Hematology  (+) Blood dyscrasia (Hb 9.3), anemia ,   Anesthesia Other Findings   Reproductive/Obstetrics                            Anesthesia Physical Anesthesia Plan  ASA: II  Anesthesia Plan: MAC   Post-op Pain Management:    Induction:   PONV Risk Score and Plan: Treatment may vary due to age or medical condition, Ondansetron and Dexamethasone  Airway Management Planned:   Additional Equipment:   Intra-op Plan:   Post-operative Plan:   Informed Consent: I have reviewed the patients History and Physical, chart, labs and discussed the procedure including the risks, benefits and alternatives for the proposed anesthesia with the patient or authorized representative who has indicated his/her understanding and acceptance.   Dental advisory given  Plan Discussed with: CRNA and Anesthesiologist  Anesthesia Plan Comments:         Anesthesia Quick Evaluation

## 2017-05-07 ENCOUNTER — Encounter (HOSPITAL_COMMUNITY): Payer: Self-pay | Admitting: Anesthesiology

## 2017-05-07 ENCOUNTER — Encounter (HOSPITAL_COMMUNITY)
Admission: EM | Disposition: A | Discharge status: Home / Self Care | Payer: Self-pay | Source: Home / Self Care | Attending: Emergency Medicine

## 2017-05-07 ENCOUNTER — Inpatient Hospital Stay (HOSPITAL_COMMUNITY): Payer: Medicare HMO | Admitting: Anesthesiology

## 2017-05-07 DIAGNOSIS — R1013 Epigastric pain: Secondary | ICD-10-CM | POA: Diagnosis not present

## 2017-05-07 DIAGNOSIS — Z79899 Other long term (current) drug therapy: Secondary | ICD-10-CM | POA: Diagnosis not present

## 2017-05-07 DIAGNOSIS — R531 Weakness: Secondary | ICD-10-CM | POA: Diagnosis not present

## 2017-05-07 DIAGNOSIS — R197 Diarrhea, unspecified: Secondary | ICD-10-CM | POA: Diagnosis not present

## 2017-05-07 DIAGNOSIS — K2211 Ulcer of esophagus with bleeding: Secondary | ICD-10-CM

## 2017-05-07 DIAGNOSIS — Z9889 Other specified postprocedural states: Secondary | ICD-10-CM | POA: Diagnosis not present

## 2017-05-07 DIAGNOSIS — K449 Diaphragmatic hernia without obstruction or gangrene: Secondary | ICD-10-CM | POA: Diagnosis not present

## 2017-05-07 DIAGNOSIS — K2270 Barrett's esophagus without dysplasia: Secondary | ICD-10-CM | POA: Diagnosis not present

## 2017-05-07 DIAGNOSIS — K921 Melena: Secondary | ICD-10-CM | POA: Diagnosis not present

## 2017-05-07 DIAGNOSIS — D649 Anemia, unspecified: Secondary | ICD-10-CM | POA: Diagnosis not present

## 2017-05-07 DIAGNOSIS — R0602 Shortness of breath: Secondary | ICD-10-CM | POA: Diagnosis not present

## 2017-05-07 DIAGNOSIS — K922 Gastrointestinal hemorrhage, unspecified: Secondary | ICD-10-CM | POA: Diagnosis not present

## 2017-05-07 DIAGNOSIS — K920 Hematemesis: Secondary | ICD-10-CM | POA: Diagnosis not present

## 2017-05-07 DIAGNOSIS — Z23 Encounter for immunization: Secondary | ICD-10-CM | POA: Diagnosis not present

## 2017-05-07 HISTORY — PX: ESOPHAGOGASTRODUODENOSCOPY (EGD) WITH PROPOFOL: SHX5813

## 2017-05-07 LAB — TYPE AND SCREEN
ABO/RH(D): A POS
Antibody Screen: NEGATIVE
UNIT DIVISION: 0
UNIT DIVISION: 0
UNIT DIVISION: 0
Unit division: 0

## 2017-05-07 LAB — BPAM RBC
BLOOD PRODUCT EXPIRATION DATE: 201811142359
BLOOD PRODUCT EXPIRATION DATE: 201811192359
Blood Product Expiration Date: 201811142359
Blood Product Expiration Date: 201811302359
ISSUE DATE / TIME: 201811070924
ISSUE DATE / TIME: 201811070924
ISSUE DATE / TIME: 201811071148
ISSUE DATE / TIME: 201811071413
UNIT TYPE AND RH: 6200
UNIT TYPE AND RH: 6200
Unit Type and Rh: 6200
Unit Type and Rh: 6200

## 2017-05-07 LAB — CBC
HCT: 25.8 % — ABNORMAL LOW (ref 36.0–46.0)
HEMATOCRIT: 25.2 % — AB (ref 36.0–46.0)
Hemoglobin: 8.4 g/dL — ABNORMAL LOW (ref 12.0–15.0)
Hemoglobin: 8.6 g/dL — ABNORMAL LOW (ref 12.0–15.0)
MCH: 25.7 pg — AB (ref 26.0–34.0)
MCH: 25.7 pg — ABNORMAL LOW (ref 26.0–34.0)
MCHC: 33.3 g/dL (ref 30.0–36.0)
MCHC: 33.3 g/dL (ref 30.0–36.0)
MCV: 77.1 fL — ABNORMAL LOW (ref 78.0–100.0)
MCV: 77.2 fL — AB (ref 78.0–100.0)
PLATELETS: 193 10*3/uL (ref 150–400)
Platelets: 164 10*3/uL (ref 150–400)
RBC: 3.27 MIL/uL — ABNORMAL LOW (ref 3.87–5.11)
RBC: 3.34 MIL/uL — ABNORMAL LOW (ref 3.87–5.11)
RDW: 19.6 % — ABNORMAL HIGH (ref 11.5–15.5)
RDW: 19.9 % — AB (ref 11.5–15.5)
WBC: 8.4 10*3/uL (ref 4.0–10.5)
WBC: 7.4 10*3/uL (ref 4.0–10.5)

## 2017-05-07 LAB — BASIC METABOLIC PANEL
Anion gap: 4 — ABNORMAL LOW (ref 5–15)
BUN: 22 mg/dL — AB (ref 6–20)
CALCIUM: 8.3 mg/dL — AB (ref 8.9–10.3)
CO2: 19 mmol/L — AB (ref 22–32)
Chloride: 118 mmol/L — ABNORMAL HIGH (ref 101–111)
Creatinine, Ser: 0.85 mg/dL (ref 0.44–1.00)
GFR calc Af Amer: >60 mL/min (ref >60)
GLUCOSE: 87 mg/dL (ref 65–99)
Potassium: 3.5 mmol/L (ref 3.5–5.1)
Sodium: 141 mmol/L (ref 135–145)

## 2017-05-07 LAB — MRSA PCR SCREENING: MRSA BY PCR: NEGATIVE

## 2017-05-07 SURGERY — ESOPHAGOGASTRODUODENOSCOPY (EGD) WITH PROPOFOL
Anesthesia: Monitor Anesthesia Care

## 2017-05-07 MED ORDER — PROPOFOL 500 MG/50ML IV EMUL
INTRAVENOUS | Status: DC | PRN
  Administered 2017-05-07: 75 ug/kg/min via INTRAVENOUS

## 2017-05-07 MED ORDER — ONDANSETRON HCL 4 MG/2ML IJ SOLN
INTRAMUSCULAR | Status: DC | PRN
  Administered 2017-05-07: 4 mg via INTRAVENOUS

## 2017-05-07 MED ORDER — PANTOPRAZOLE SODIUM 40 MG PO TBEC: 40.0000 mg | DELAYED_RELEASE_TABLET | Freq: Two times a day (BID) | ORAL | 1 refills | Status: DC

## 2017-05-07 MED ORDER — BUTAMBEN-TETRACAINE-BENZOCAINE 2-2-14 % EX AERO
INHALATION_SPRAY | CUTANEOUS | Status: DC | PRN
  Administered 2017-05-07: 2 via TOPICAL

## 2017-05-07 MED ORDER — PROPOFOL 10 MG/ML IV BOLUS
INTRAVENOUS | Status: DC | PRN
  Administered 2017-05-07 (×2): 20 mg via INTRAVENOUS

## 2017-05-07 MED ORDER — SODIUM CHLORIDE 0.9 % IV SOLN: INTRAVENOUS | Status: DC

## 2017-05-07 MED ORDER — SODIUM CHLORIDE 0.9 % IV SOLN
INTRAVENOUS | Status: DC | PRN
  Administered 2017-05-07: 11:00:00 via INTRAVENOUS

## 2017-05-07 SURGICAL SUPPLY — 14 items

## 2017-05-07 NOTE — Care Management Obs Status (Deleted)
MEDICARE OBSERVATION STATUS NOTIFICATION   Patient Details  Name: Ezequiel KayserMartha J Wambold MRN: [id number] Date of Birth: [birth year]   Medicare Observation Status Notification Given:       Verdene LennertGoldean, Billye Nydam K, RN 05/07/2017, 2:22 PM

## 2017-05-07 NOTE — Discharge Summary (Signed)
Name: Carolyn Elliott MRN: [id number] DOB: 1941-06-25 76 y.o. PCP: Patient, No Pcp Per  Date of Admission: 05/06/2017  7:41 AM Date of Discharge: 05/07/2017 Attending Physician: Burns SpainButcher, Elizabeth A, MD  Discharge Diagnosis: 1. Upper GI Bleed 2. GERD  Principal Problem:   Acute upper GI bleed Active Problems:   GERD (gastroesophageal reflux disease)  Discharge Medications: Allergies as of 05/07/2017   No Known Allergies     Medication List    TAKE these medications   fexofenadine 180 MG tablet Commonly known as:  ALLEGRA Take 180 mg daily as needed by mouth for allergies.   pantoprazole 40 MG tablet Commonly known as:  PROTONIX Take 1 tablet (40 mg total) 2 (two) times daily by mouth.   venlafaxine XR 37.5 MG 24 hr capsule Commonly known as:  EFFEXOR-XR Take 37.5 mg daily by mouth.      Disposition and follow-up:   [name].  At the hospital follow up visit please address:  1.  Upper GI Bleed. Ensure she was able to schedule a follow-up with GI. Discuss whether she is taking her iron supplementation and PPI twice daily.   2.  Labs / imaging needed at time of follow-up: None  3.  Pending labs/ test needing follow-up: None  Follow-up Appointments: Follow-up Information    Willis Modenautlaw, William, MD. Call.   Specialty:  Gastroenterology Contact information: [address] [place] [address] [phone]        Levora DredgeHelberg, Connelly [place], MD Follow up on 05/13/2017.   Specialty:  Internal Medicine Why:  @ 1:45 PM Contact information: [address] [place] [address] [phone]          Hospital Course by problem list: Principal Problem:   Acute upper GI bleed Active Problems:   GERD (gastroesophageal reflux disease)   1. Upper GI Bleed. Carolyn Elliott is a 76 y.o. previously healthy female who presented with 1 month of melena and hematemesis. In the ED she was found to  have a hemoglobin of 4.3 and was transfused 4 units. GI was consulted who took her for an EGD. EGD illustrated  Esophagitis with findings of Barrett's esophagus and a hiatal hernia with Cameron's erosions (complete findings below). Her hemoglobin remained stable at 8.5. She was discharged with instructions to start taking a PPI twice daily and to follow-up with GI. She was also found to have a ferritin of 7 and told to begin taking iron supplementation. She was hemodynamically stable on discharge.    2. GERD. She will follow up with GI and take a PPI twice daily until her follow-up.   Discharge Vitals:   BP (!) 119/39   Pulse 67   Temp 99 F (37.2 C) (Oral)   Resp (!) 26   Ht 5\' 2"  (1.575 m)   Wt 136 lb 6.4 oz (61.9 kg)   SpO2 93%   BMI 24.95 kg/m   Pertinent Labs, Studies, and Procedures:   EGD Findings  - Large complicated paraesophageal and hiatal hernia. - Cameron's erosions. - Esophagitis. - Suspected Barrett's esophagus occupying the majority of patient's esophagus. - Normal stomach. - Normal duodenal bulb,  Discharge Instructions: Discharge Instructions    Diet - low sodium heart healthy   Complete by:  As directed    Increase activity slowly   Complete by:  As directed      Signed: Levora DredgeHelberg, Leisel Pinette, MD 05/07/2017, 1:49 PM  My Pager: [phone]

## 2017-05-07 NOTE — Care Management CC44 (Signed)
Condition Code 44 Documentation Completed  Patient Details  Name: Carolyn Elliott MRN: [id number] Date of Birth: Mar 21, 1941   Condition Code 44 given:  Yes Patient signature on Condition Code 44 notice:  Yes Documentation of 2 MD's agreement:  Yes Code 44 added to claim:  Yes    Verdene LennertGoldean, Emmanual Gauthreaux K, RN 05/07/2017, 2:29 PM

## 2017-05-07 NOTE — Progress Notes (Signed)
Subjective: Patient reports a complete return to baseline and denies all presenting symptoms.  Denies any fatigue, weakness, light headedness, vomiting, or black stool. Discussed the results of her EGD and need for outpatient follow up. Patient is very eager to return home and denies any further questions or concerns.  Objective: Vital signs in last 24 hours: Vitals:   05/07/17 0430 05/07/17 0751 05/07/17 0800 05/07/17 0945  BP: (!) 121/56  (!) 150/56 (!) 143/61  Pulse: 69 72 71   Resp: 20 (!) 21 (!) 22 20  Temp: 97.6 F (36.4 C) 98 F (36.7 C) 98.6 F (37 C) 98.6 F (37 C)  TempSrc: Axillary Oral Oral Oral  SpO2: 97% 98% 100% 100%  Weight: 61.9 kg (136 lb 6.4 oz)     Height:       Weight change:   Intake/Output Summary (Last 24 hours) at 05/07/2017 1052 Last data filed at 05/07/2017 0753 Gross per 24 hour  Intake 1899.67 ml  Output 1500 ml  Net 399.67 ml   Physical Exam General: well appearing, sitting comfortably in bed, no acute distress HENT: Eyes: no conjunctival pallor Heart: RRR, no murmurs rubs or gallops, fixed Split S2  Lungs: CTAB, normal work of breathing Abd: soft, nontender, nondistended, normal bowel sounds Neuro: alert and oriented x4 Skin: warm no rashes, normal skin turgor, brisk capillary refill   Micro Results: Recent Results (from the past 240 hour(s))  MRSA PCR Screening     Status: None   Collection Time: 05/06/17  9:23 PM  Result Value Ref Range Status   MRSA by PCR NEGATIVE NEGATIVE Final    Comment:        The GeneXpert MRSA Assay (FDA approved for NASAL specimens only), is one component of a comprehensive MRSA colonization surveillance program. It is not intended to diagnose MRSA infection nor to guide or monitor treatment for MRSA infections.    Studies/Results: Ct Abdomen Pelvis W Contrast  Result Date: 05/06/2017 CLINICAL DATA:  Vomiting, diarrhea for months. Stool positive, blood transfusion today. EXAM: CT ABDOMEN AND PELVIS  WITH CONTRAST TECHNIQUE: Multidetector CT imaging of the abdomen and pelvis was performed using the standard protocol following bolus administration of intravenous contrast. CONTRAST:  100mL ISOVUE-300 IOPAMIDOL (ISOVUE-300) INJECTION 61% COMPARISON:  None. FINDINGS: Lower chest: Lung bases are clear. Large hiatal hernia, partially imaged. Hepatobiliary: No focal liver abnormality is seen. No gallstones, gallbladder wall thickening, or biliary dilatation. Pancreas: Unremarkable. No pancreatic ductal dilatation or surrounding inflammatory changes. Spleen: Normal in size without focal abnormality. Adrenals/Urinary Tract: Right renal cortex scarring. No suspicious mass or lesion within either kidney. No renal stone or hydronephrosis. No ureteral or bladder calculi identified. Bladder appears normal. Adrenal glands are unremarkable. Stomach/Bowel: Bowel is normal in caliber. No bowel wall thickening or evidence of bowel wall inflammation. Scattered diverticulosis within the descending and sigmoid colon without evidence of acute diverticulitis. Appendix is normal. Vascular/Lymphatic: Aortic atherosclerosis. No enlarged abdominal or pelvic lymph nodes. Reproductive: Uterus and bilateral adnexa are unremarkable. Other: No free fluid or abscess collection. No free intraperitoneal air. Musculoskeletal: Degenerative change within the scoliotic lumbar spine, mild to moderate in degree. No acute or suspicious osseous finding. IMPRESSION: 1. No acute findings. No bowel obstruction or evidence of bowel wall inflammation. No free fluid or hemorrhage. No evidence of acute solid organ abnormality. 2. Colonic diverticulosis without evidence of acute diverticulitis. 3. Large hiatal hernia, incompletely imaged. 4. Aortic atherosclerosis. Electronically Signed   By: Bary RichardStan  Maynard M.D.   On: 05/06/2017  14:20   Medications: I have reviewed the patient's current medications. Scheduled Meds: . [MAR Hold] Influenza vac split quadrivalent  PF  0.5 mL Intramuscular Tomorrow-1000  . [MAR Hold] pantoprazole  40 mg Intravenous Q12H  . [MAR Hold] sodium chloride flush  3 mL Intravenous Q12H  . [MAR Hold] venlafaxine XR  37.5 mg Oral Daily   Continuous Infusions: . [MAR Hold] sodium chloride    . sodium chloride    . pantoprozole (PROTONIX) infusion 8 mg/hr (05/07/17 0924)   PRN Meds:.butamben-tetracaine-benzocaine Assessment/Plan: Principal Problem:   Acute upper GI bleed Active Problems:   GERD (gastroesophageal reflux disease)  Anemia 2/2 upper GI bleed EGD showed large paraesophageal and hiatal hernia, linear erosions consistent with camerons erosion. Unable to obtain biopsy today due to esophagitis. Mid lower mucosal findings consistent with barrett's esophagus throughout the majority of her esophagus. -Outpatient GI follow up - PPI bid 2-3 months  Discharge -Discharge today  This is a Psychologist, occupationalMedical Student Note.  The care of the patient was discussed with Dr. Caron PresumeHelberg and the assessment and plan formulated with their assistance.  Please see their attached note for official documentation of the daily encounter.   LOS: 1 day   Lisette GrinderMcMillian, Shantera Monts A, Medical Student 05/07/2017, 10:52 AM My pager: [phone]

## 2017-05-07 NOTE — Op Note (Signed)
[facility] Patient Name: Carolyn Elliott Procedure Date : 05/07/2017 MRN: [id number] Attending MD: Willis Modena , MD Date of Birth: 06-18-1941 CSN: [id number] Age: 76 Admit Type: Inpatient Procedure:                Upper GI endoscopy Indications:              Coffee-ground emesis, Melena Providers:                Willis Modena, MD, Norman Clay, RN, Zoila Shutter,                            Technician Referring MD:              Medicines:                Monitored Anesthesia Care Complications:            No immediate complications. Estimated Blood Loss:     Estimated blood loss was minimal. Procedure:                Pre-Anesthesia Assessment:                           - Prior to the procedure, a History and Physical                            was performed, and patient medications and                            allergies were reviewed. The patient's tolerance of                            previous anesthesia was also reviewed. The risks                            and benefits of the procedure and the sedation                            options and risks were discussed with the patient.                            All questions were answered, and informed consent                            was obtained. Prior Anticoagulants: The patient has                            taken no previous anticoagulant or antiplatelet                            agents. ASA Grade Assessment: II - A patient with                            mild systemic disease. After reviewing the risks  and benefits, the patient was deemed in                            satisfactory condition to undergo the procedure.                           After obtaining informed consent, the endoscope was                            passed under direct vision. Throughout the                            procedure, the patient's blood pressure, pulse, and                            oxygen saturations were  monitored continuously. The                            [id number]-[id number] ([id number]) scope was introduced through the                            mouth, and advanced to the second part of duodenum.                            The upper GI endoscopy was accomplished without                            difficulty. The patient tolerated the procedure                            well. Scope In: Scope Out: Findings:      A large complicated paraesophageal and hiatal hernia was present. 10+ cm       in length. Several linear erosions within the hernia sac, consistent       with Cameron's erosions.      Mild LA-A mid-to-distal esophagitis and what appears to be       salmon-colored mucosa consistent with Barrett's esophagus throughout the       majority of the length of her esophagus, extending just distal to the       upper esophageal sphincter. In light of esophagitis, biopsies were not       obtained.      The entire examined stomach was normal.      The duodenal bulb, first portion of the duodenum and second portion of       the duodenum were normal. Impression:               - Large complicated paraesophageal and hiatal                            hernia.                           - Cameron's erosions.                           - Esophagitis.                           -  Suspected Barrett's esophagus occupying the                            majority of patient's esophagus.                           - Normal stomach.                           - Normal duodenal bulb, first portion of the                            duodenum and second portion of the duodenum.                           - Suspect coffee ground emesis, melena, and anemia,                            is due to patient's large paraesophageal/hiatal                            hernia and Cameron's erosions. No active bleeding                            witnessed. Moderate Sedation:      None Recommendation:           - Return patient to  hospital [facility] for ongoing care.                           - Resume previous diet today.                           - Follow an antireflux regimen indefinitely.                           - Continue present medications.                           - BID PPI, oral iron repletion (e.g., Nu-Iron 150                            mg po qd).                           Deboraha Sprang- [facility] will follow. Patient ok for discharge                            home from [facility], with close outpatient                            follow-up (will need follow-up in a couple weeks                            for symptom update and repeat labs). Will also  likely need outpatient UGI series to characterize                            her hernia as well as repeat endoscopy in 2-3                            months for esophageal biopsies once her esophagitis                            has resolved. Patient has previously declined                            colonoscopy; would consider CT (virtual)                            colonography as outpatient as well, if she still                            does not want to proceed with colonoscopy. Procedure Code(s):        --- Professional ---                           [id number], Esophagogastroduodenoscopy, flexible,                            transoral; diagnostic, including collection of                            specimen(s) by brushing or washing, when performed                            (separate procedure) Diagnosis Code(s):        --- Professional ---                           K44.9, Diaphragmatic hernia without obstruction or                            gangrene                           K92.0, Hematemesis                           K92.1, Melena (includes Hematochezia) CPT copyright 2016 American Medical Association. All rights reserved. The codes documented in this report are preliminary and upon coder review may  be revised to meet current compliance  requirements. Willis Modena, MD 05/07/2017 11:26:37 AM This report has been signed electronically. Number of Addenda: 0

## 2017-05-07 NOTE — Interval H&P Note (Signed)
History and Physical Interval Note:  05/07/2017 10:38 AM  Carolyn KayserMartha J Elliott  has presented today for surgery, with the diagnosis of Coffee-ground emesis, melena  The various methods of treatment have been discussed with the patient and family. After consideration of risks, benefits and other options for treatment, the patient has consented to  Procedure(s): ESOPHAGOGASTRODUODENOSCOPY (EGD) WITH PROPOFOL (N/A) as a surgical intervention .  The patient's history has been reviewed, patient examined, no change in status, stable for surgery.  I have reviewed the patient's chart and labs.  Questions were answered to the patient's satisfaction.     Freddy JakschUTLAW,Aigner Horseman M

## 2017-05-07 NOTE — Anesthesia Postprocedure Evaluation (Signed)
Anesthesia Post Note  Patient: Carolyn Elliott  Procedure(s) Performed: ESOPHAGOGASTRODUODENOSCOPY (EGD) WITH PROPOFOL (N/A )     Patient location during evaluation: PACU Anesthesia Type: MAC Level of consciousness: awake and alert Pain management: pain level controlled Vital Signs Assessment: post-procedure vital signs reviewed and stable Respiratory status: spontaneous breathing and respiratory function stable Cardiovascular status: stable Postop Assessment: no apparent nausea or vomiting Anesthetic complications: no    Last Vitals:  Vitals:   05/07/17 1120 05/07/17 1130  BP: (!) 111/46 (!) 119/39  Pulse: 64 67  Resp: 18 (!) 26  Temp:    SpO2: 95% 93%    Last Pain:  Vitals:   05/07/17 1113  TempSrc: Oral  PainSc:                  Nishan Ovens DANIEL

## 2017-05-07 NOTE — Transfer of Care (Signed)
Immediate Anesthesia Transfer of Care Note  Patient: Carolyn Elliott  Procedure(s) Performed: ESOPHAGOGASTRODUODENOSCOPY (EGD) WITH PROPOFOL (N/A )  Patient Location: PACU  Anesthesia Type:MAC  Level of Consciousness: drowsy and patient cooperative  Airway & Oxygen Therapy: Patient Spontanous Breathing and Patient connected to nasal cannula oxygen  Post-op Assessment: Report given to RN and Post -op Vital signs reviewed and stable  Post vital signs: Reviewed  Last Vitals:  Vitals:   05/07/17 0800 05/07/17 0945  BP: (!) 150/56 (!) 143/61  Pulse: 71   Resp: (!) 22 20  Temp: 37 C 37 C  SpO2: 100% 100%    Last Pain:  Vitals:   05/07/17 0945  TempSrc: Oral  PainSc:          Complications: No apparent anesthesia complications

## 2017-05-07 NOTE — Progress Notes (Signed)
Date: 05/07/2017  Patient name: Carolyn KayserMartha J Elliott  Medical record number: [id number]  Date of birth: 10-22-1940   I have seen and evaluated Carolyn KayserMartha J Elliott and discussed their care with the Residency Team. Carolyn Elliott is a 76 yo female who presented via EMS due to fatigue and weakness. She reports a one-month history of intermittent melanotic stools. This is been associated with dyspnea on exertion and leg weakness. On the evening prior to admission, she had nausea and dark black emesis with continued dark loose bowel movements. Associated symptoms included lightheadedness, diaphoresis, epigastric pain. She reported to Dr. Allena KatzPatel that she had not taken her Nexium recently but told me today that she had not stopped taking it.   On admission, she was found to have hemoglobin of 4.30 and was transfused 4 units. Her hemoglobin responded appropriately and has been stable at 8.4/8.5 for the past 2 lab values for the past 17 hours. She is now status post EGD which showed a large complicated paraesophageal and hiatal hernia; Linear erosions in the hiatal hernia consistent with Cameron's erosions; changes consistent with Barrett's esophagus that it was not able to be biopsied due to esophagitis. GI recommended repeat EGD for esophageal biopsies in 2-3 months after the esophagitis has healed.  This morning, she is hungry and wanted to eat. Her lunch tray which looked like softs has just arrived. She denied a sore throat. She states she feels back to baseline in regards to her energy and weakness. She has not ambulated in her room due to with assistance restrictions. She does not have a PCP but is willing to obtain a PCP now.   Vitals:   05/07/17 1120 05/07/17 1130  BP: (!) 111/46 (!) 119/39  Pulse: 64 67  Resp: 18 (!) 26  Temp:    SpO2: 95% 93%  T max 99 HRRR no MRG LCTAB anteriorly, no wheezing ABD + BS, soft, NT  Ferritin 7 HgB 4.3 -4 units PRBC - 7.6 - 9.3 - 8.5 - 8.4 MCV 68 RDW 20 Plts 164 Cr 0.85 Coags  nl  CT ABD : Nl liver, aortic atherosclerosis, lg HH, colonic diverticulosis  I personally viewed the EKG and confirmed my reading with the official read. Sinus, nl axis, no ischemic changes  Assessment and Plan: I have seen and evaluated the patient as outlined above. I agree with the formulated Assessment and Plan as detailed in the residents' note, with the following changes: Carolyn. Carolyn Elliott is a 76 year old woman who presented with symptomatic anemia with a hemoglobin of 8.4 and a ferritin of 7. With the exception of hypothermia, she was hemodynamically stable on admission. She was hydrated with IV fluids and transfused 4 units of packed red blood cells. Her hemoglobin responded appropriately and has not drifted down indicating she has no further significant GI bleeding. Her MCV is microcytic and her ferritin was 7, indicating this is iron deficiency microcytic anemia. Due to the profound anemia, she likely has had a slow but steady GI loss. Her EGD showed Cameron's erosions which are likely the etiology for her GI bleed. Additionally, she was found to have changes consistent with Barrett's esophagus but due to esophagitis, biopsies were not able to be obtained.  1. Microcytic iron deficiency anemia 2. Upper GI bleed 3. Cameron's erosions She remains hemodynamically stable and her hemoglobin is stable after transfusion. She is starting a soft diet currently. She is also on a PPI twice a day. She had not been taking her PPI at home  on an empty stomach and has been educated to do so. GI feels that she is stable to go home. We will see how she does eating and continue to follow her hemoglobin and make a decision regarding this dispo this afternoon. She will need to go home on a PPI twice a day and iron supplementation.  4. Esophagitis 5. Changes consistent with Barrett's She will need to be on a PPI 2-3 months to heal the esophagitis and then return for an EGD to biopsy the Barrett's.  D/C 11/8 or  11/9  Burns SpainButcher, Eutimio Gharibian A, MD 11/8/201812:53 PM

## 2017-05-07 NOTE — Progress Notes (Signed)
   Subjective: Doing well this afternoon. Just had lunch and it is sitting well. She said that GI discussed the results of her EGD and discussed how to take her PPI. She will follow-up with GI as an outpatient. Asking to go home. Discussed that she will monitor for another hour then discharge home with follow-up.   Objective: Vital signs in last 24 hours: Vitals:   05/06/17 2102 05/07/17 0000 05/07/17 0014 05/07/17 0430  BP: (!) 154/71  (!) 151/69 (!) 121/56  Pulse: 79  79 69  Resp: 20  (!) 22 20  Temp: 98.4 F (36.9 C)  97.9 F (36.6 C) 97.6 F (36.4 C)  TempSrc: Oral  Oral Axillary  SpO2: 100% 96%  97%  Weight: 137 lb (62.1 kg)   136 lb 6.4 oz (61.9 kg)  Height: 5\' 2"  (1.575 m)      General: Thin female with in no acute distress Pulm: Good air movement with no wheezing or crackles CV: RRR, possible S2 splitting  Abdomen: Active bowel sounds, soft, no tenderness to palpation  Extremities: No LE edema   Assessment/Plan:  1. Symptomatic Anemia 2/2 Upper GI Bleed - Presented with a 1 month history of melena and hematemesis likely 2/2 PUD vs upper GI malignancy  - Hgb trend: 4.3 -> 7.6 -> 9.3 -> 8.5 -> 8.4 -> 8.6  - Ferritin 7 - EGD showing hiatal hernia with cameron's erosions and mild esophagitis and findings consistent with Barrett's esophagus.  - Will discharge on oral PPI and will need GI follow-up  - No biopsies taken due to esophagitis   2. GERD - Currently on a PPI drip  - Discharge on oral PPI twice daily. Given instructions to take twice daily on an empty stomach at least 30 minutes prior to meals   Dispo: Anticipated discharge in approximately 0 day(s).   Levora DredgeHelberg, Irelyn Perfecto, MD 05/07/2017, 5:48 AM My Pager: [phone]

## 2017-05-07 NOTE — Care Management Obs Status (Signed)
MEDICARE OBSERVATION STATUS NOTIFICATION   Patient Details  Name: Ezequiel KayserMartha J Sahakian MRN: [id number] Date of Birth: 08-28-1940   Medicare Observation Status Notification Given:  Yes    Verdene LennertGoldean, Taegen Lennox K, RN 05/07/2017, 2:28 PM

## 2017-05-07 NOTE — Discharge Instructions (Signed)
Thank you for allowing us to provide your care.   - We have sent out a prescription for Protonix 40 mg. Please take in on an empty stomach 30 minutes before breakfast and dinner.   - Please call Dr. Hulen Shoutsutlaw's office [phone]) to schedule a follow-up appointment   - You have a hospital follow-[facility] at 1:45 PM  Barrett Esophagus Barrett esophagus occurs when the tissue that lines the esophagus changes or becomes damaged. The esophagus is the tube that carries food from the throat to the stomach. With Barrett esophagus, the cells that line the esophagus are replaced by cells that are similar to the lining of the intestines (intestinal metaplasia). Barrett esophagus itself may not cause any symptoms. However, many people who have Barrett esophagus also have gastroesophageal reflux disease (GERD), which may cause symptoms such as heartburn. Treatment may include medicines, procedures to destroy the abnormal cells, or surgery. Over time, a few people with this condition may develop cancer of the esophagus. What are the causes? The exact cause of this condition is not known. In some cases, the condition develops from damage to the lining of the esophagus caused by GERD. GERD occurs when stomach acids flow up from the stomach into the esophagus. Frequent symptoms of GERD may cause intestinal metaplasia or cause cell changes (dysplasia). What increases the risk? The following factors may make you more likely to develop this condition:  Having GERD.  Being any of the following: ? Female. ? White (Caucasian). ? Obese. ? Older than 50.  Having a hiatal hernia.  Smoking.  What are the signs or symptoms? People with Barrett esophagus often have no symptoms. However, many people with this condition also have GERD. Symptoms of GERD may include:  Heartburn.  Difficulty swallowing.  Dry cough.  How is this  diagnosed? Barrett esophagus may be diagnosed with an exam called an upper gastrointestinal endoscopy. During this exam, a thin, flexible tube (endoscope) is passed down your esophagus. The endoscope has a light and camera on the end of it. Your health care provider uses the endoscope to view the inside of your esophagus. During the exam, several tissue samples will be removed (biopsy) from your esophagus so they can be checked for intestinal metaplasia or dysplasia. How is this treated? Treatment for this condition may include:  Medicines (proton pump inhibitors, or PPIs) to decrease or stop GERD.  Periodic endoscopic exams to make sure that cancer is not developing.  A procedure or surgery for dysplasia. This may include: ? Endoscopic removal or destruction of abnormal cells. ? Removal of part of the esophagus (esophagectomy).  Follow these instructions at home: Eating and drinking  Eat more fruits and vegetables.  Avoid fatty foods.  Eat small, frequent meals instead of large meals.  Avoid foods that cause heartburn. These foods include: ? Coffee and alcoholic drinks. ? Tomatoes and foods made with tomatoes. ? Greasy or spicy foods. ? Chocolate and peppermint. General instructions   Take over-the-counter and prescription medicines only as told by your health care provider.  Do not use any tobacco products, such as cigarettes, chewing tobacco, and e-cigarettes. If you need help quitting, ask your health care provider.  If your health care provider is treating you for GERD, make sure you follow all instructions and take medicines as directed.  Keep all follow-up visits as told by your health care provider. This is important. Contact a health care  provider if:  You have heartburn or GERD symptoms.  You have difficulty swallowing. Get help right away if:  You have chest pain.  You are unable to swallow.  You vomit blood or material that looks like coffee  grounds.  Your stool (feces) is bright red or dark. This information is not intended to replace advice given to you by your health care provider. Make sure you discuss any questions you have with your health care provider. Document Released: 09/06/2003 Document Revised: 11/22/2015 Document Reviewed: 03/29/2015 Elsevier Interactive Patient Education  2018 [employer].  Soft-Food Meal Plan A soft-food meal plan includes foods that are safe and easy to swallow. This meal plan typically is used:  If you are having trouble chewing or swallowing foods.  As a transition meal plan after only having had liquid meals for a long period.  What do I need to know about the soft-food meal plan? A soft-food meal plan includes tender foods that are soft and easy to chew and swallow. In most cases, bite-sized pieces of food are easier to swallow. A bite-sized piece is about  inch or smaller. Foods in this plan do not need to be ground or pureed. Foods that are very hard, crunchy, or sticky should be avoided. Also, breads, cereals, yogurts, and desserts with nuts, seeds, or fruits should be avoided. What foods can I eat? Grains Rice and wild rice. Moist bread, dressing, pasta, and noodles. Well-moistened dry or cooked cereals, such as farina (cooked wheat cereal), oatmeal, or grits. Biscuits, breads, muffins, pancakes, and waffles that have been well moistened. Vegetables Shredded lettuce. Cooked, tender vegetables, including potatoes without skins. Vegetable juices. Broths or creamed soups made with vegetables that are not stringy or chewy. Strained tomatoes (without seeds). Fruits Canned or well-cooked fruits. Soft (ripe), peeled fresh fruits, such as peaches, nectarines, kiwi, cantaloupe, honeydew melon, and watermelon (without seeds). Soft berries with small seeds, such as strawberries. Fruit juices (without pulp). Meats and Other Protein Sources Moist, tender, lean beef. Mutton. Lamb. Veal. Chicken.  [place]. Liver. Ham. Fish without bones. Eggs. Dairy Milk, milk drinks, and cream. Plain cream cheese and cottage cheese. Plain yogurt. Sweets/Desserts Flavored gelatin desserts. Custard. Plain ice cream, frozen yogurt, sherbet, milk shakes, and malts. Plain cakes and cookies. Plain hard candy. Other Butter, margarine (without trans fat), and cooking oils. Mayonnaise. Cream sauces. Mild spices, salt, and sugar. Syrup, molasses, honey, and jelly. The items listed above may not be a complete list of recommended foods or beverages. Contact your dietitian for more options. What foods are not recommended? Grains Dry bread, toast, crackers that have not been moistened. Coarse or dry cereals, such as bran, granola, and shredded wheat. Tough or chewy crusty breads, such as [place] bread or baguettes. Vegetables Corn. Raw vegetables except shredded lettuce. Cooked vegetables that are tough or stringy. Tough, crisp, fried potatoes and potato skins. Fruits Fresh fruits with skins or seeds or both, such as apples, pears, or grapes. Stringy, high-pulp fruits, such as papaya, pineapple, coconut, or mango. Fruit leather, fruit roll-ups, and all dried fruits. Meats and Other Protein Sources Sausages and hot dogs. Meats with gristle. Fish with bones. Nuts, seeds, and chunky peanut or other nut butters. Sweets/Desserts Cakes or cookies that are very dry or chewy. The items listed above may not be a complete list of foods and beverages to avoid. Contact your dietitian for more information. This information is not intended to replace advice given to you by your health care provider. Make sure  you discuss any questions you have with your health care provider. Document Released: 09/23/2007 Document Revised: 11/22/2015 Document Reviewed: 05/13/2013 Elsevier Interactive Patient Education  2017 [employer]

## 2017-05-07 NOTE — Anesthesia Procedure Notes (Signed)
Procedure Name: MAC Date/Time: 05/07/2017 10:50 AM Performed by: Jenne Campus, CRNA Pre-anesthesia Checklist: Patient identified, Emergency Drugs available, Suction available, Patient being monitored and Timeout performed Oxygen Delivery Method: Nasal cannula

## 2017-05-08 ENCOUNTER — Encounter (HOSPITAL_COMMUNITY): Payer: Self-pay | Admitting: Gastroenterology

## 2017-05-11 LAB — CULTURE, BLOOD (ROUTINE X 2)
Culture: NO GROWTH
Culture: NO GROWTH
Special Requests: ADEQUATE

## 2017-05-13 ENCOUNTER — Other Ambulatory Visit: Payer: Self-pay

## 2017-05-13 ENCOUNTER — Encounter: Payer: Self-pay | Admitting: Internal Medicine

## 2017-05-13 ENCOUNTER — Ambulatory Visit (INDEPENDENT_AMBULATORY_CARE_PROVIDER_SITE_OTHER): Payer: Medicare HMO | Admitting: Internal Medicine

## 2017-05-13 DIAGNOSIS — Z800 Family history of malignant neoplasm of digestive organs: Secondary | ICD-10-CM

## 2017-05-13 DIAGNOSIS — K922 Gastrointestinal hemorrhage, unspecified: Secondary | ICD-10-CM

## 2017-05-13 DIAGNOSIS — I10 Essential (primary) hypertension: Secondary | ICD-10-CM

## 2017-05-13 DIAGNOSIS — Z8249 Family history of ischemic heart disease and other diseases of the circulatory system: Secondary | ICD-10-CM

## 2017-05-13 MED ORDER — PANTOPRAZOLE SODIUM 40 MG PO TBEC: 40.0000 mg | DELAYED_RELEASE_TABLET | Freq: Two times a day (BID) | ORAL | 2 refills | Status: DC

## 2017-05-13 MED ORDER — FERROUS SULFATE 325 (65 FE) MG PO TABS: 325.0000 mg | ORAL_TABLET | Freq: Every day | ORAL | 2 refills | Status: AC

## 2017-05-13 NOTE — Progress Notes (Signed)
   CC: GI bleeding  HPI:  [name] is a 76 y.o. female who presents today for a follow-up of an upper GI bleed. She denied concerns today including fatigue, cold intolerance, constipation, visual changes, hematemesis, bloody stool, hematochezia, abdominal pain, chest pain or urinary changes. States that she was able to mow her yard, clean her entire home and prepare several meals without concern since discharge. She was not sure if she was to be taking the iron tablets a they were not prescribed.  Past Medical History:  Diagnosis Date  . Depression   . GERD (gastroesophageal reflux disease)    Review of Systems:  ROS negative except as per HPI.  Social: -Denied tobacco use -EtOH use 8oz glass of wine 2 times weekly, socially  -Illicit drug use  Family: -Father MI 1167, deceased as a result -Brother, esophageal cancer, CABG, recurrent cancer, deceased at 5659  Physical Exam:  Vitals:   05/13/17 1429  BP: (!) 163/79  Pulse: 82  Temp: (!) 97.4 F (36.3 C)  TempSrc: Oral  SpO2: 100%  Weight: 139 lb 6.4 oz (63.2 kg)  Height: 5\' 2"  (1.575 m)   Physical Exam  Constitutional: She appears well-developed and well-nourished. No distress.  Cardiovascular: Normal rate and regular rhythm.  No murmur heard. Pulmonary/Chest: Effort normal and breath sounds normal. No stridor. No respiratory distress.  Abdominal: Soft. Bowel sounds are normal. She exhibits no distension. There is no tenderness.  Musculoskeletal: She exhibits no edema or tenderness.  Skin: Skin is warm. Capillary refill takes less than 2 seconds.    Assessment & Plan:   See Encounters Tab for problem based charting.  Patient seen with Dr. Sandre Kittyaines

## 2017-05-13 NOTE — Assessment & Plan Note (Signed)
Would like to defer until her next visit to address as she typically does not experience this issue

## 2017-05-13 NOTE — Patient Instructions (Addendum)
I have prescribed the iron tablet and refilled your pantoprazole. Please take both of these as directed on the bottle.  I would like for you to schedule with a primary doctor within a few weeks at their next available appointment.  [facility]. If you every have any questions or concerns please call the clinic any time or visit the ED if they are emergent.

## 2017-05-15 NOTE — Progress Notes (Signed)
Internal Medicine Clinic Attending  I saw and evaluated the patient.  I personally confirmed the key portions of the history and exam documented by Dr. Crista ElliotHarbrecht and I reviewed pertinent patient test results.  The assessment, diagnosis, and plan were formulated together and I agree with the documentation in the resident's note.  Doing well after GI bleed, no further bleeding. Reviewed iron supplementation.   Anne ShutterAlexander N Raines, MD

## 2017-05-18 DIAGNOSIS — K219 Gastro-esophageal reflux disease without esophagitis: Secondary | ICD-10-CM | POA: Diagnosis not present

## 2017-05-18 DIAGNOSIS — K922 Gastrointestinal hemorrhage, unspecified: Secondary | ICD-10-CM | POA: Diagnosis not present

## 2017-06-03 ENCOUNTER — Other Ambulatory Visit: Payer: Self-pay | Admitting: Gastroenterology

## 2017-06-03 DIAGNOSIS — D509 Iron deficiency anemia, unspecified: Secondary | ICD-10-CM

## 2017-06-03 DIAGNOSIS — K449 Diaphragmatic hernia without obstruction or gangrene: Secondary | ICD-10-CM | POA: Diagnosis not present

## 2017-06-03 DIAGNOSIS — K219 Gastro-esophageal reflux disease without esophagitis: Secondary | ICD-10-CM | POA: Diagnosis not present

## 2017-06-05 ENCOUNTER — Other Ambulatory Visit: Payer: Self-pay | Admitting: Gastroenterology

## 2017-06-05 DIAGNOSIS — K449 Diaphragmatic hernia without obstruction or gangrene: Secondary | ICD-10-CM

## 2017-06-05 DIAGNOSIS — D509 Iron deficiency anemia, unspecified: Secondary | ICD-10-CM

## 2017-06-17 ENCOUNTER — Ambulatory Visit
Admission: RE | Admit: 2017-06-17 | Discharge: 2017-06-17 | Disposition: A | Discharge status: Home / Self Care | Payer: Medicare HMO | Source: Ambulatory Visit | Attending: Gastroenterology | Admitting: Gastroenterology

## 2017-06-17 DIAGNOSIS — K449 Diaphragmatic hernia without obstruction or gangrene: Secondary | ICD-10-CM

## 2017-06-17 DIAGNOSIS — D509 Iron deficiency anemia, unspecified: Secondary | ICD-10-CM

## 2017-07-06 ENCOUNTER — Ambulatory Visit
Admission: RE | Admit: 2017-07-06 | Discharge: 2017-07-06 | Disposition: A | Discharge status: Home / Self Care | Payer: Medicare HMO | Source: Ambulatory Visit | Attending: Gastroenterology | Admitting: Gastroenterology

## 2017-07-06 DIAGNOSIS — K449 Diaphragmatic hernia without obstruction or gangrene: Secondary | ICD-10-CM

## 2017-07-06 DIAGNOSIS — D509 Iron deficiency anemia, unspecified: Secondary | ICD-10-CM | POA: Diagnosis not present

## 2017-07-08 DIAGNOSIS — I10 Essential (primary) hypertension: Secondary | ICD-10-CM | POA: Diagnosis not present

## 2017-07-08 DIAGNOSIS — D509 Iron deficiency anemia, unspecified: Secondary | ICD-10-CM | POA: Diagnosis not present

## 2017-07-08 DIAGNOSIS — Z23 Encounter for immunization: Secondary | ICD-10-CM | POA: Diagnosis not present

## 2017-07-08 DIAGNOSIS — Z136 Encounter for screening for cardiovascular disorders: Secondary | ICD-10-CM | POA: Diagnosis not present

## 2017-07-08 DIAGNOSIS — E785 Hyperlipidemia, unspecified: Secondary | ICD-10-CM | POA: Diagnosis not present

## 2017-07-08 DIAGNOSIS — Z0000 Encounter for general adult medical examination without abnormal findings: Secondary | ICD-10-CM | POA: Diagnosis not present

## 2017-07-25 ENCOUNTER — Other Ambulatory Visit: Payer: Self-pay | Admitting: Internal Medicine

## 2017-08-01 ENCOUNTER — Other Ambulatory Visit: Payer: Self-pay | Admitting: Internal Medicine

## 2017-08-04 ENCOUNTER — Other Ambulatory Visit: Payer: Self-pay | Admitting: Internal Medicine

## 2017-08-06 ENCOUNTER — Other Ambulatory Visit: Payer: Self-pay | Admitting: Internal Medicine

## 2017-09-01 DIAGNOSIS — D509 Iron deficiency anemia, unspecified: Secondary | ICD-10-CM | POA: Diagnosis not present

## 2017-09-01 DIAGNOSIS — K219 Gastro-esophageal reflux disease without esophagitis: Secondary | ICD-10-CM | POA: Diagnosis not present

## 2017-09-01 DIAGNOSIS — K209 Esophagitis, unspecified: Secondary | ICD-10-CM | POA: Diagnosis not present

## 2018-07-20 DIAGNOSIS — F419 Anxiety disorder, unspecified: Secondary | ICD-10-CM | POA: Diagnosis not present

## 2018-07-20 DIAGNOSIS — D509 Iron deficiency anemia, unspecified: Secondary | ICD-10-CM | POA: Diagnosis not present

## 2018-07-20 DIAGNOSIS — K219 Gastro-esophageal reflux disease without esophagitis: Secondary | ICD-10-CM | POA: Diagnosis not present

## 2018-07-20 DIAGNOSIS — E785 Hyperlipidemia, unspecified: Secondary | ICD-10-CM | POA: Diagnosis not present

## 2018-07-20 DIAGNOSIS — Z23 Encounter for immunization: Secondary | ICD-10-CM | POA: Diagnosis not present

## 2018-07-20 DIAGNOSIS — I10 Essential (primary) hypertension: Secondary | ICD-10-CM | POA: Diagnosis not present

## 2018-07-21 DIAGNOSIS — E785 Hyperlipidemia, unspecified: Secondary | ICD-10-CM | POA: Diagnosis not present

## 2018-07-21 DIAGNOSIS — D509 Iron deficiency anemia, unspecified: Secondary | ICD-10-CM | POA: Diagnosis not present

## 2018-07-21 DIAGNOSIS — I10 Essential (primary) hypertension: Secondary | ICD-10-CM | POA: Diagnosis not present

## 2018-07-21 DIAGNOSIS — K219 Gastro-esophageal reflux disease without esophagitis: Secondary | ICD-10-CM | POA: Diagnosis not present

## 2018-12-23 DIAGNOSIS — K219 Gastro-esophageal reflux disease without esophagitis: Secondary | ICD-10-CM | POA: Diagnosis not present

## 2019-11-05 IMAGING — CT CT ABD-PELV W/ CM
2 of 5 series · 16 of 46 positions shown, 18 images · IV contrast (Omni 300)
Comparison: None.

CLINICAL DATA: Vomiting, diarrhea for months. Stool positive, blood
transfusion today.

EXAM:
CT ABDOMEN AND PELVIS WITH CONTRAST
TECHNIQUE: Multidetector CT imaging of the abdomen and pelvis was performed
using the standard protocol following bolus administration of
intravenous contrast.
CONTRAST:  100mL [id number] IOPAMIDOL ([id number]) INJECTION 61%

[Series 3: a/p w/ 5mm · axial · 0.78mm/px · z∈[+1077,+1442]mm · 13 of 83 slices shown, 15 images]
[im 5/83  soft-tissue]
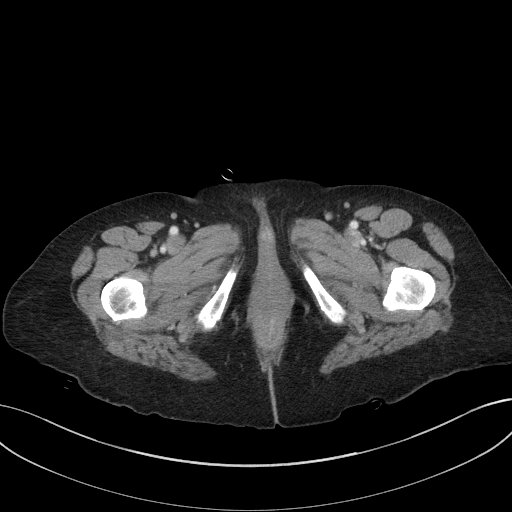
[im 5/83  bone]
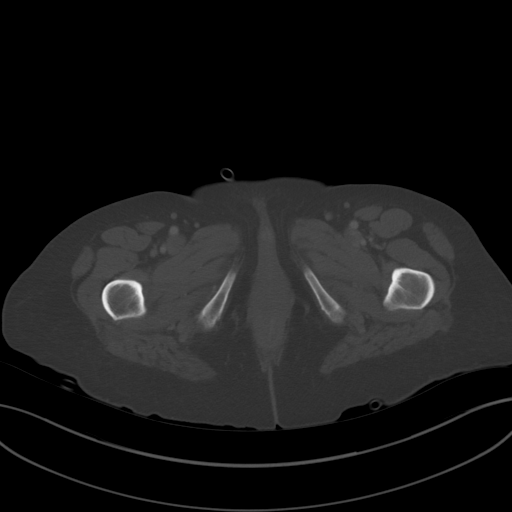
[im 13/83  soft-tissue]
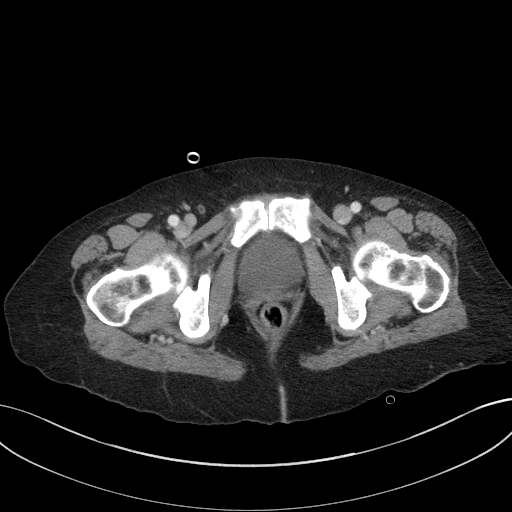
[im 18/83  soft-tissue]
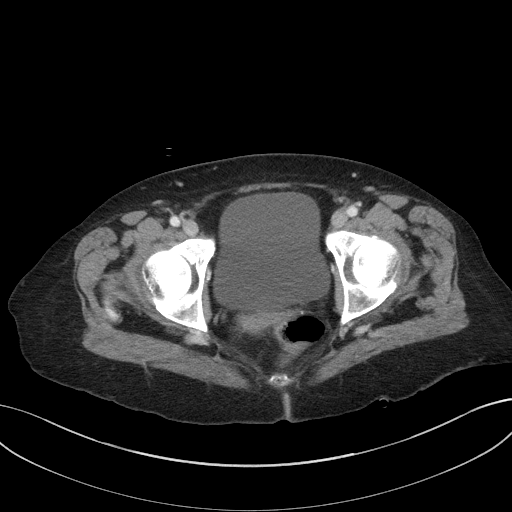
[im 22/83  soft-tissue]
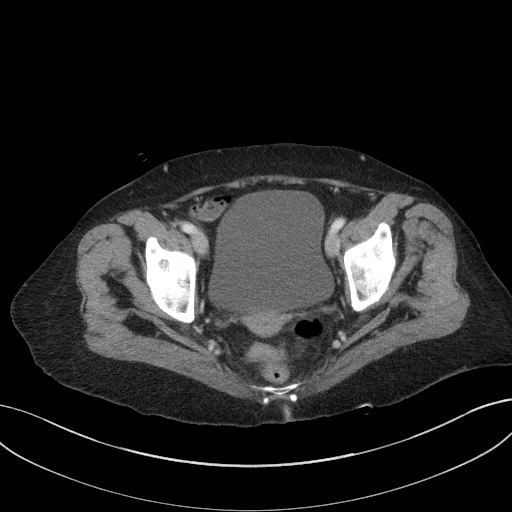
[im 31/83  soft-tissue]
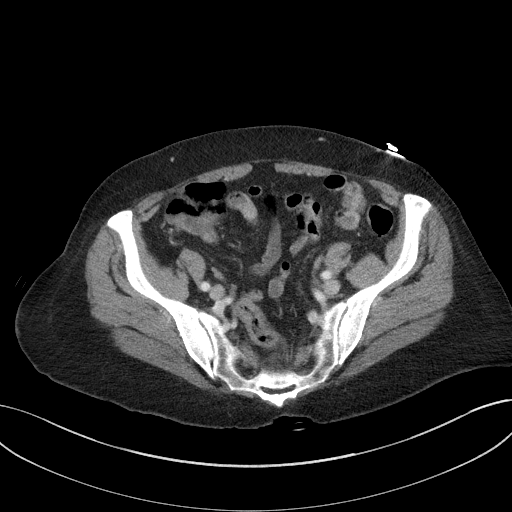
[im 35/83  soft-tissue]
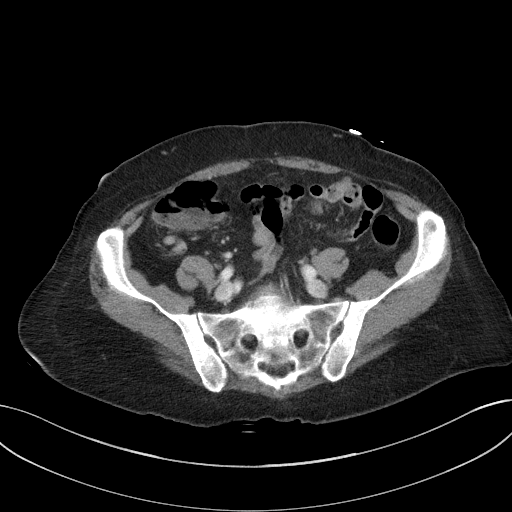
[im 44/83  soft-tissue]
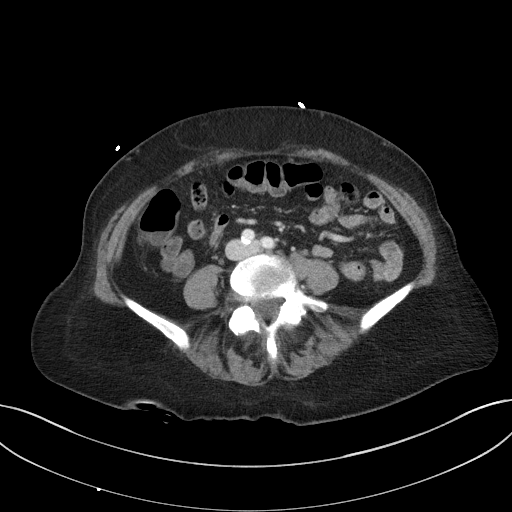
[im 48/83  soft-tissue]
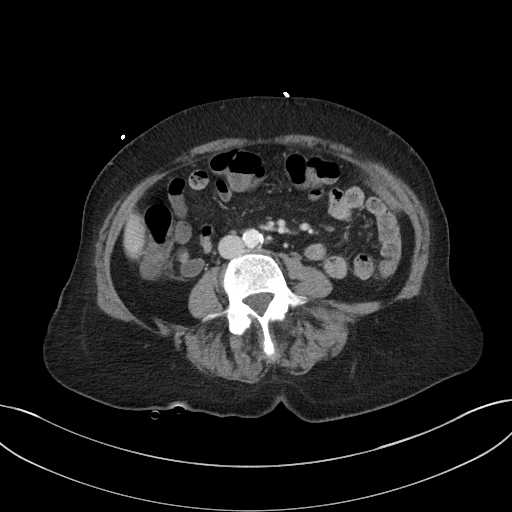
[im 52/83  soft-tissue]
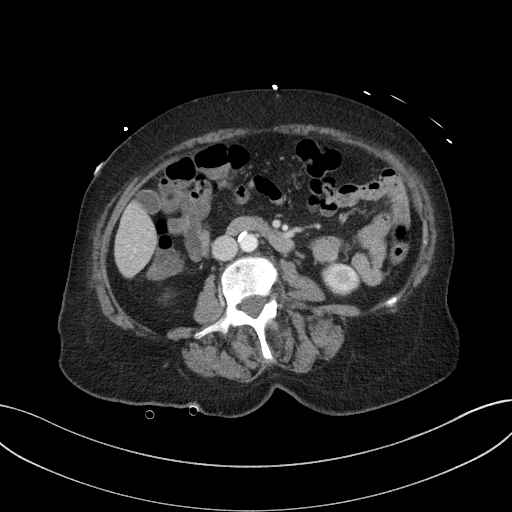
[im 52/83  bone]
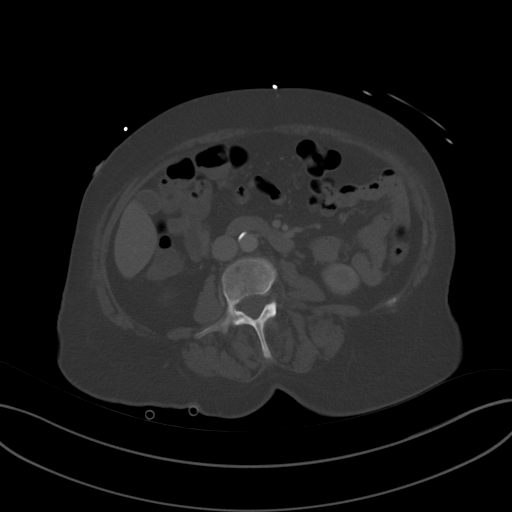
[im 61/83  soft-tissue]
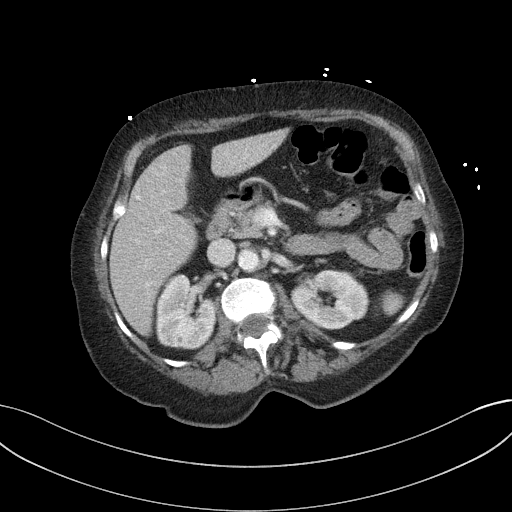
[im 65/83  soft-tissue]
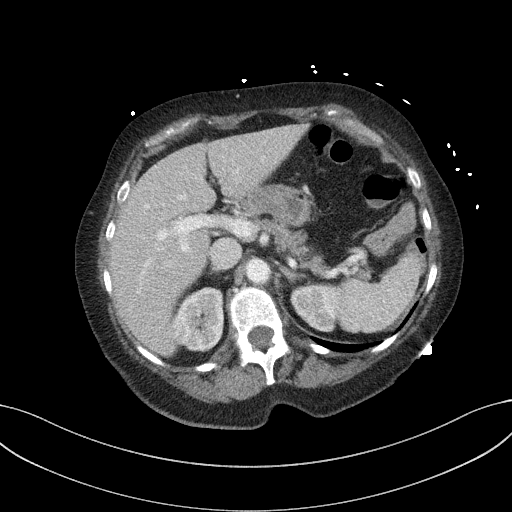
[im 70/83  soft-tissue]
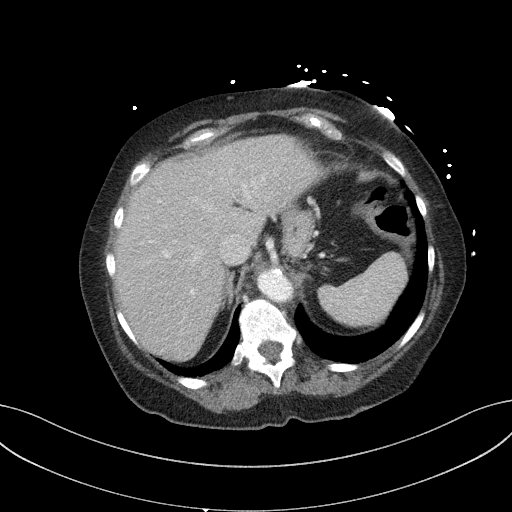
[im 78/83  soft-tissue]
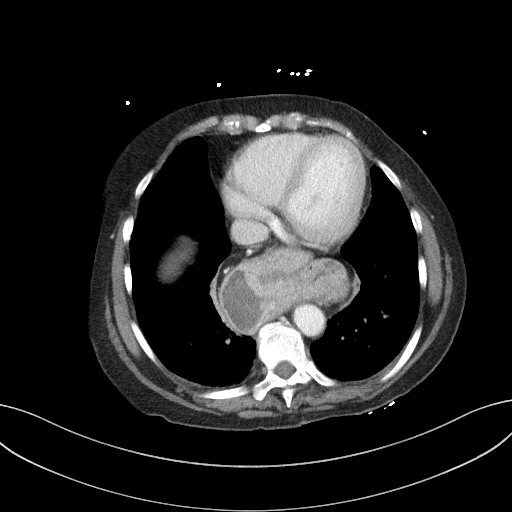

[Series 6: a/p w/ cor · coronal · 0.75mm/px · 3 of 150 slices shown]
[im 50/150  soft-tissue]
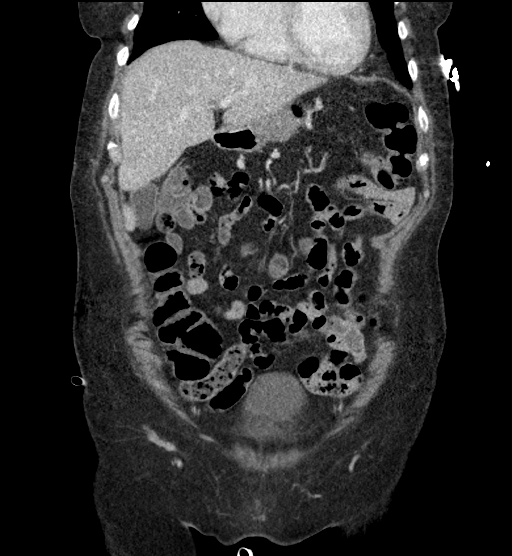
[im 67/150  soft-tissue]
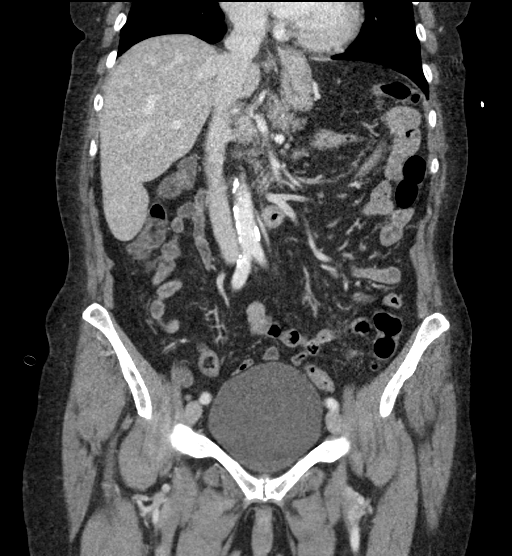
[im 83/150  soft-tissue]
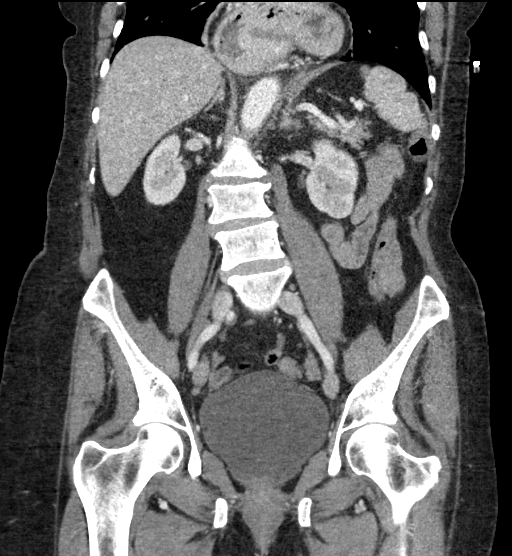

[16 of 46 positions shown; findings below may reference images not displayed]

FINDINGS: Lower chest: Lung bases are clear. Large hiatal hernia, partially
imaged.

Hepatobiliary: No focal liver abnormality is seen. No gallstones,
gallbladder wall thickening, or biliary dilatation.

Pancreas: Unremarkable. No pancreatic ductal dilatation or
surrounding inflammatory changes.

Spleen: Normal in size without focal abnormality.

Adrenals/Urinary Tract: Right renal cortex scarring. No suspicious
mass or lesion within either kidney. No renal stone or
hydronephrosis. No ureteral or bladder calculi identified. Bladder
appears normal. Adrenal glands are unremarkable.

Stomach/Bowel: Bowel is normal in caliber. No bowel wall thickening
or evidence of bowel wall inflammation. Scattered diverticulosis
within the descending and sigmoid colon without evidence of acute
diverticulitis. Appendix is normal.

Vascular/Lymphatic: Aortic atherosclerosis. No enlarged abdominal or
pelvic lymph nodes.

Reproductive: Uterus and bilateral adnexa are unremarkable.

Other: No free fluid or abscess collection. No free intraperitoneal
air.

Musculoskeletal: Degenerative change within the scoliotic lumbar
spine, mild to moderate in degree. No acute or suspicious osseous
finding.
IMPRESSION: 1. No acute findings. No bowel obstruction or evidence of bowel wall
inflammation. No free fluid or hemorrhage. No evidence of acute
solid organ abnormality.
2. Colonic diverticulosis without evidence of acute diverticulitis.
3. Large hiatal hernia, incompletely imaged.
4. Aortic atherosclerosis.

## 2019-11-29 DEATH — deceased

## 2020-01-31 DIAGNOSIS — H524 Presbyopia: Secondary | ICD-10-CM | POA: Diagnosis not present

## 2020-01-31 DIAGNOSIS — H2513 Age-related nuclear cataract, bilateral: Secondary | ICD-10-CM | POA: Diagnosis not present

## 2020-02-02 DIAGNOSIS — Z0100 Encounter for examination of eyes and vision without abnormal findings: Secondary | ICD-10-CM | POA: Diagnosis not present

## 2020-03-26 DIAGNOSIS — Z1389 Encounter for screening for other disorder: Secondary | ICD-10-CM | POA: Diagnosis not present

## 2020-03-26 DIAGNOSIS — Z0000 Encounter for general adult medical examination without abnormal findings: Secondary | ICD-10-CM | POA: Diagnosis not present

## 2020-03-27 DIAGNOSIS — K209 Esophagitis, unspecified without bleeding: Secondary | ICD-10-CM | POA: Diagnosis not present

## 2020-03-27 DIAGNOSIS — Z1211 Encounter for screening for malignant neoplasm of colon: Secondary | ICD-10-CM | POA: Diagnosis not present

## 2020-03-27 DIAGNOSIS — K5900 Constipation, unspecified: Secondary | ICD-10-CM | POA: Diagnosis not present

## 2020-03-27 DIAGNOSIS — D509 Iron deficiency anemia, unspecified: Secondary | ICD-10-CM | POA: Diagnosis not present

## 2020-03-28 ENCOUNTER — Ambulatory Visit
Admission: RE | Admit: 2020-03-28 | Discharge: 2020-03-28 | Disposition: A | Discharge status: Home / Self Care | Payer: Medicare HMO | Source: Ambulatory Visit | Attending: Gastroenterology | Admitting: Gastroenterology

## 2020-03-28 ENCOUNTER — Other Ambulatory Visit: Payer: Self-pay | Admitting: Gastroenterology

## 2020-03-28 DIAGNOSIS — K5900 Constipation, unspecified: Secondary | ICD-10-CM

## 2020-05-15 DIAGNOSIS — K209 Esophagitis, unspecified without bleeding: Secondary | ICD-10-CM | POA: Diagnosis not present

## 2020-05-15 DIAGNOSIS — Z1211 Encounter for screening for malignant neoplasm of colon: Secondary | ICD-10-CM | POA: Diagnosis not present

## 2020-05-17 DIAGNOSIS — Z01812 Encounter for preprocedural laboratory examination: Secondary | ICD-10-CM | POA: Diagnosis not present

## 2020-05-22 DIAGNOSIS — K219 Gastro-esophageal reflux disease without esophagitis: Secondary | ICD-10-CM | POA: Diagnosis not present

## 2020-05-22 DIAGNOSIS — K2270 Barrett's esophagus without dysplasia: Secondary | ICD-10-CM | POA: Diagnosis not present

## 2020-05-22 DIAGNOSIS — K317 Polyp of stomach and duodenum: Secondary | ICD-10-CM | POA: Diagnosis not present

## 2020-05-29 DIAGNOSIS — K317 Polyp of stomach and duodenum: Secondary | ICD-10-CM | POA: Diagnosis not present

## 2020-05-29 DIAGNOSIS — K2270 Barrett's esophagus without dysplasia: Secondary | ICD-10-CM | POA: Diagnosis not present

## 2020-08-14 DIAGNOSIS — K219 Gastro-esophageal reflux disease without esophagitis: Secondary | ICD-10-CM | POA: Diagnosis not present

## 2020-08-14 DIAGNOSIS — K5900 Constipation, unspecified: Secondary | ICD-10-CM | POA: Diagnosis not present

## 2020-08-14 DIAGNOSIS — K2270 Barrett's esophagus without dysplasia: Secondary | ICD-10-CM | POA: Diagnosis not present

## 2020-09-06 DIAGNOSIS — I10 Essential (primary) hypertension: Secondary | ICD-10-CM | POA: Diagnosis not present

## 2020-09-10 DIAGNOSIS — I10 Essential (primary) hypertension: Secondary | ICD-10-CM | POA: Diagnosis not present

## 2020-09-10 DIAGNOSIS — E785 Hyperlipidemia, unspecified: Secondary | ICD-10-CM | POA: Diagnosis not present

## 2020-09-10 DIAGNOSIS — H539 Unspecified visual disturbance: Secondary | ICD-10-CM | POA: Diagnosis not present

## 2020-09-10 DIAGNOSIS — D509 Iron deficiency anemia, unspecified: Secondary | ICD-10-CM | POA: Diagnosis not present

## 2020-09-10 DIAGNOSIS — K219 Gastro-esophageal reflux disease without esophagitis: Secondary | ICD-10-CM | POA: Diagnosis not present

## 2020-09-10 DIAGNOSIS — F419 Anxiety disorder, unspecified: Secondary | ICD-10-CM | POA: Diagnosis not present

## 2020-10-23 DIAGNOSIS — E785 Hyperlipidemia, unspecified: Secondary | ICD-10-CM | POA: Diagnosis not present

## 2020-10-23 DIAGNOSIS — H539 Unspecified visual disturbance: Secondary | ICD-10-CM | POA: Diagnosis not present

## 2020-10-23 DIAGNOSIS — I10 Essential (primary) hypertension: Secondary | ICD-10-CM | POA: Diagnosis not present

## 2021-02-18 DIAGNOSIS — F419 Anxiety disorder, unspecified: Secondary | ICD-10-CM | POA: Diagnosis not present

## 2021-02-18 DIAGNOSIS — I10 Essential (primary) hypertension: Secondary | ICD-10-CM | POA: Diagnosis not present

## 2021-02-18 DIAGNOSIS — E785 Hyperlipidemia, unspecified: Secondary | ICD-10-CM | POA: Diagnosis not present

## 2021-04-17 DIAGNOSIS — Z1389 Encounter for screening for other disorder: Secondary | ICD-10-CM | POA: Diagnosis not present

## 2021-04-17 DIAGNOSIS — Z23 Encounter for immunization: Secondary | ICD-10-CM | POA: Diagnosis not present

## 2021-04-17 DIAGNOSIS — Z0000 Encounter for general adult medical examination without abnormal findings: Secondary | ICD-10-CM | POA: Diagnosis not present

## 2021-05-07 DIAGNOSIS — I10 Essential (primary) hypertension: Secondary | ICD-10-CM | POA: Diagnosis not present

## 2021-05-07 DIAGNOSIS — F419 Anxiety disorder, unspecified: Secondary | ICD-10-CM | POA: Diagnosis not present

## 2021-05-07 DIAGNOSIS — E785 Hyperlipidemia, unspecified: Secondary | ICD-10-CM | POA: Diagnosis not present

## 2021-05-14 DIAGNOSIS — H52223 Regular astigmatism, bilateral: Secondary | ICD-10-CM | POA: Diagnosis not present

## 2021-05-14 DIAGNOSIS — H524 Presbyopia: Secondary | ICD-10-CM | POA: Diagnosis not present

## 2021-05-14 DIAGNOSIS — H18413 Arcus senilis, bilateral: Secondary | ICD-10-CM | POA: Diagnosis not present

## 2021-05-14 DIAGNOSIS — H25813 Combined forms of age-related cataract, bilateral: Secondary | ICD-10-CM | POA: Diagnosis not present

## 2021-07-16 DIAGNOSIS — H2513 Age-related nuclear cataract, bilateral: Secondary | ICD-10-CM | POA: Diagnosis not present

## 2021-07-16 DIAGNOSIS — H2511 Age-related nuclear cataract, right eye: Secondary | ICD-10-CM | POA: Diagnosis not present

## 2021-07-16 DIAGNOSIS — H25043 Posterior subcapsular polar age-related cataract, bilateral: Secondary | ICD-10-CM | POA: Diagnosis not present

## 2021-07-16 DIAGNOSIS — H25013 Cortical age-related cataract, bilateral: Secondary | ICD-10-CM | POA: Diagnosis not present

## 2021-07-16 DIAGNOSIS — H18413 Arcus senilis, bilateral: Secondary | ICD-10-CM | POA: Diagnosis not present

## 2021-08-05 DIAGNOSIS — N1831 Chronic kidney disease, stage 3a: Secondary | ICD-10-CM | POA: Diagnosis not present

## 2021-08-05 DIAGNOSIS — E785 Hyperlipidemia, unspecified: Secondary | ICD-10-CM | POA: Diagnosis not present

## 2021-08-05 DIAGNOSIS — F419 Anxiety disorder, unspecified: Secondary | ICD-10-CM | POA: Diagnosis not present

## 2021-08-05 DIAGNOSIS — I10 Essential (primary) hypertension: Secondary | ICD-10-CM | POA: Diagnosis not present

## 2021-08-05 DIAGNOSIS — I129 Hypertensive chronic kidney disease with stage 1 through stage 4 chronic kidney disease, or unspecified chronic kidney disease: Secondary | ICD-10-CM | POA: Diagnosis not present

## 2021-09-16 DIAGNOSIS — K5900 Constipation, unspecified: Secondary | ICD-10-CM | POA: Diagnosis not present

## 2021-09-16 DIAGNOSIS — R1033 Periumbilical pain: Secondary | ICD-10-CM | POA: Diagnosis not present

## 2021-09-16 DIAGNOSIS — R109 Unspecified abdominal pain: Secondary | ICD-10-CM | POA: Diagnosis not present

## 2021-09-20 ENCOUNTER — Other Ambulatory Visit: Payer: Self-pay | Admitting: Gastroenterology

## 2021-09-20 ENCOUNTER — Ambulatory Visit
Admission: RE | Admit: 2021-09-20 | Discharge: 2021-09-20 | Disposition: A | Discharge status: Home / Self Care | Payer: Medicare HMO | Source: Ambulatory Visit | Attending: Gastroenterology | Admitting: Gastroenterology

## 2021-09-20 DIAGNOSIS — K5900 Constipation, unspecified: Secondary | ICD-10-CM | POA: Diagnosis not present

## 2021-09-20 DIAGNOSIS — M47816 Spondylosis without myelopathy or radiculopathy, lumbar region: Secondary | ICD-10-CM | POA: Diagnosis not present

## 2021-09-20 DIAGNOSIS — K449 Diaphragmatic hernia without obstruction or gangrene: Secondary | ICD-10-CM | POA: Diagnosis not present

## 2021-09-20 DIAGNOSIS — M4186 Other forms of scoliosis, lumbar region: Secondary | ICD-10-CM | POA: Diagnosis not present

## 2021-09-23 DIAGNOSIS — H2512 Age-related nuclear cataract, left eye: Secondary | ICD-10-CM | POA: Diagnosis not present

## 2021-09-23 DIAGNOSIS — H2511 Age-related nuclear cataract, right eye: Secondary | ICD-10-CM | POA: Diagnosis not present

## 2021-09-23 DIAGNOSIS — Z961 Presence of intraocular lens: Secondary | ICD-10-CM | POA: Diagnosis not present

## 2021-09-24 DIAGNOSIS — H2512 Age-related nuclear cataract, left eye: Secondary | ICD-10-CM | POA: Diagnosis not present

## 2021-10-28 DIAGNOSIS — H2512 Age-related nuclear cataract, left eye: Secondary | ICD-10-CM | POA: Diagnosis not present

## 2022-02-11 DIAGNOSIS — R202 Paresthesia of skin: Secondary | ICD-10-CM | POA: Diagnosis not present

## 2022-02-11 DIAGNOSIS — E785 Hyperlipidemia, unspecified: Secondary | ICD-10-CM | POA: Diagnosis not present

## 2022-02-11 DIAGNOSIS — I10 Essential (primary) hypertension: Secondary | ICD-10-CM | POA: Diagnosis not present

## 2022-02-11 DIAGNOSIS — F419 Anxiety disorder, unspecified: Secondary | ICD-10-CM | POA: Diagnosis not present

## 2022-02-11 DIAGNOSIS — I129 Hypertensive chronic kidney disease with stage 1 through stage 4 chronic kidney disease, or unspecified chronic kidney disease: Secondary | ICD-10-CM | POA: Diagnosis not present

## 2022-02-11 DIAGNOSIS — B079 Viral wart, unspecified: Secondary | ICD-10-CM | POA: Diagnosis not present

## 2022-02-11 DIAGNOSIS — I700 Atherosclerosis of aorta: Secondary | ICD-10-CM | POA: Diagnosis not present

## 2022-04-18 DIAGNOSIS — Z23 Encounter for immunization: Secondary | ICD-10-CM | POA: Diagnosis not present

## 2022-04-18 DIAGNOSIS — Z1389 Encounter for screening for other disorder: Secondary | ICD-10-CM | POA: Diagnosis not present

## 2022-04-18 DIAGNOSIS — Z0000 Encounter for general adult medical examination without abnormal findings: Secondary | ICD-10-CM | POA: Diagnosis not present

## 2022-04-18 DIAGNOSIS — Z6824 Body mass index (BMI) 24.0-24.9, adult: Secondary | ICD-10-CM | POA: Diagnosis not present

## 2022-04-22 DIAGNOSIS — I700 Atherosclerosis of aorta: Secondary | ICD-10-CM | POA: Diagnosis not present

## 2022-04-22 DIAGNOSIS — L659 Nonscarring hair loss, unspecified: Secondary | ICD-10-CM | POA: Diagnosis not present

## 2022-04-22 DIAGNOSIS — I10 Essential (primary) hypertension: Secondary | ICD-10-CM | POA: Diagnosis not present

## 2022-04-22 DIAGNOSIS — Z79899 Other long term (current) drug therapy: Secondary | ICD-10-CM | POA: Diagnosis not present

## 2022-04-22 DIAGNOSIS — F419 Anxiety disorder, unspecified: Secondary | ICD-10-CM | POA: Diagnosis not present

## 2022-04-22 DIAGNOSIS — I129 Hypertensive chronic kidney disease with stage 1 through stage 4 chronic kidney disease, or unspecified chronic kidney disease: Secondary | ICD-10-CM | POA: Diagnosis not present

## 2022-04-22 DIAGNOSIS — E785 Hyperlipidemia, unspecified: Secondary | ICD-10-CM | POA: Diagnosis not present

## 2022-04-22 DIAGNOSIS — R7989 Other specified abnormal findings of blood chemistry: Secondary | ICD-10-CM | POA: Diagnosis not present

## 2022-04-22 DIAGNOSIS — K219 Gastro-esophageal reflux disease without esophagitis: Secondary | ICD-10-CM | POA: Diagnosis not present

## 2022-05-06 DIAGNOSIS — K2270 Barrett's esophagus without dysplasia: Secondary | ICD-10-CM | POA: Diagnosis not present

## 2022-05-06 DIAGNOSIS — Z800 Family history of malignant neoplasm of digestive organs: Secondary | ICD-10-CM | POA: Diagnosis not present

## 2022-05-06 DIAGNOSIS — Z1211 Encounter for screening for malignant neoplasm of colon: Secondary | ICD-10-CM | POA: Diagnosis not present

## 2022-05-26 ENCOUNTER — Other Ambulatory Visit: Payer: Self-pay | Admitting: Gastroenterology

## 2022-05-26 DIAGNOSIS — Z1211 Encounter for screening for malignant neoplasm of colon: Secondary | ICD-10-CM

## 2022-06-02 DIAGNOSIS — K2270 Barrett's esophagus without dysplasia: Secondary | ICD-10-CM | POA: Diagnosis not present

## 2022-06-02 DIAGNOSIS — K5900 Constipation, unspecified: Secondary | ICD-10-CM | POA: Diagnosis not present

## 2022-06-02 DIAGNOSIS — Z1211 Encounter for screening for malignant neoplasm of colon: Secondary | ICD-10-CM | POA: Diagnosis not present

## 2022-06-02 DIAGNOSIS — R195 Other fecal abnormalities: Secondary | ICD-10-CM | POA: Diagnosis not present

## 2022-06-02 DIAGNOSIS — R109 Unspecified abdominal pain: Secondary | ICD-10-CM | POA: Diagnosis not present

## 2022-06-18 ENCOUNTER — Inpatient Hospital Stay: Admission: RE | Admit: 2022-06-18 | Payer: Medicare HMO | Source: Ambulatory Visit

## 2022-07-23 ENCOUNTER — Ambulatory Visit
Admission: RE | Admit: 2022-07-23 | Discharge: 2022-07-23 | Disposition: A | Discharge status: Home / Self Care | Payer: Medicare HMO | Source: Ambulatory Visit | Attending: Gastroenterology | Admitting: Gastroenterology

## 2022-07-23 DIAGNOSIS — Z1211 Encounter for screening for malignant neoplasm of colon: Secondary | ICD-10-CM | POA: Diagnosis not present

## 2022-07-23 DIAGNOSIS — K5900 Constipation, unspecified: Secondary | ICD-10-CM | POA: Diagnosis not present

## 2022-07-23 DIAGNOSIS — K5730 Diverticulosis of large intestine without perforation or abscess without bleeding: Secondary | ICD-10-CM | POA: Diagnosis not present

## 2022-07-23 DIAGNOSIS — K449 Diaphragmatic hernia without obstruction or gangrene: Secondary | ICD-10-CM | POA: Diagnosis not present

## 2022-08-01 DIAGNOSIS — K2270 Barrett's esophagus without dysplasia: Secondary | ICD-10-CM | POA: Diagnosis not present

## 2022-08-01 DIAGNOSIS — R1013 Epigastric pain: Secondary | ICD-10-CM | POA: Diagnosis not present

## 2022-08-01 DIAGNOSIS — K920 Hematemesis: Secondary | ICD-10-CM | POA: Diagnosis not present

## 2022-08-01 DIAGNOSIS — K449 Diaphragmatic hernia without obstruction or gangrene: Secondary | ICD-10-CM | POA: Diagnosis not present

## 2022-08-05 DIAGNOSIS — K2270 Barrett's esophagus without dysplasia: Secondary | ICD-10-CM | POA: Diagnosis not present

## 2022-08-14 DIAGNOSIS — Z6824 Body mass index (BMI) 24.0-24.9, adult: Secondary | ICD-10-CM | POA: Diagnosis not present

## 2022-08-14 DIAGNOSIS — N1831 Chronic kidney disease, stage 3a: Secondary | ICD-10-CM | POA: Diagnosis not present

## 2022-08-14 DIAGNOSIS — I700 Atherosclerosis of aorta: Secondary | ICD-10-CM | POA: Diagnosis not present

## 2022-08-14 DIAGNOSIS — K2270 Barrett's esophagus without dysplasia: Secondary | ICD-10-CM | POA: Diagnosis not present

## 2022-08-14 DIAGNOSIS — F419 Anxiety disorder, unspecified: Secondary | ICD-10-CM | POA: Diagnosis not present

## 2022-08-14 DIAGNOSIS — R29890 Loss of height: Secondary | ICD-10-CM | POA: Diagnosis not present

## 2022-08-14 DIAGNOSIS — I129 Hypertensive chronic kidney disease with stage 1 through stage 4 chronic kidney disease, or unspecified chronic kidney disease: Secondary | ICD-10-CM | POA: Diagnosis not present

## 2022-08-14 DIAGNOSIS — I10 Essential (primary) hypertension: Secondary | ICD-10-CM | POA: Diagnosis not present

## 2022-08-14 DIAGNOSIS — E785 Hyperlipidemia, unspecified: Secondary | ICD-10-CM | POA: Diagnosis not present

## 2023-01-21 DIAGNOSIS — F419 Anxiety disorder, unspecified: Secondary | ICD-10-CM | POA: Diagnosis not present

## 2023-01-21 DIAGNOSIS — F32A Depression, unspecified: Secondary | ICD-10-CM | POA: Diagnosis not present

## 2023-01-21 DIAGNOSIS — N1831 Chronic kidney disease, stage 3a: Secondary | ICD-10-CM | POA: Diagnosis not present

## 2023-01-21 DIAGNOSIS — E785 Hyperlipidemia, unspecified: Secondary | ICD-10-CM | POA: Diagnosis not present

## 2023-01-21 DIAGNOSIS — I129 Hypertensive chronic kidney disease with stage 1 through stage 4 chronic kidney disease, or unspecified chronic kidney disease: Secondary | ICD-10-CM | POA: Diagnosis not present

## 2023-01-21 DIAGNOSIS — K2270 Barrett's esophagus without dysplasia: Secondary | ICD-10-CM | POA: Diagnosis not present

## 2023-02-10 DIAGNOSIS — I10 Essential (primary) hypertension: Secondary | ICD-10-CM | POA: Diagnosis not present

## 2023-02-10 DIAGNOSIS — K2270 Barrett's esophagus without dysplasia: Secondary | ICD-10-CM | POA: Diagnosis not present

## 2023-02-10 DIAGNOSIS — I700 Atherosclerosis of aorta: Secondary | ICD-10-CM | POA: Diagnosis not present

## 2023-02-10 DIAGNOSIS — N1831 Chronic kidney disease, stage 3a: Secondary | ICD-10-CM | POA: Diagnosis not present

## 2023-02-10 DIAGNOSIS — E785 Hyperlipidemia, unspecified: Secondary | ICD-10-CM | POA: Diagnosis not present

## 2023-02-10 DIAGNOSIS — I129 Hypertensive chronic kidney disease with stage 1 through stage 4 chronic kidney disease, or unspecified chronic kidney disease: Secondary | ICD-10-CM | POA: Diagnosis not present

## 2023-02-10 DIAGNOSIS — F419 Anxiety disorder, unspecified: Secondary | ICD-10-CM | POA: Diagnosis not present

## 2023-02-10 DIAGNOSIS — Z6824 Body mass index (BMI) 24.0-24.9, adult: Secondary | ICD-10-CM | POA: Diagnosis not present

## 2023-02-13 DIAGNOSIS — K449 Diaphragmatic hernia without obstruction or gangrene: Secondary | ICD-10-CM | POA: Diagnosis not present

## 2023-02-13 DIAGNOSIS — K2270 Barrett's esophagus without dysplasia: Secondary | ICD-10-CM | POA: Diagnosis not present

## 2023-02-13 DIAGNOSIS — Z1211 Encounter for screening for malignant neoplasm of colon: Secondary | ICD-10-CM | POA: Diagnosis not present

## 2023-04-13 DIAGNOSIS — N289 Disorder of kidney and ureter, unspecified: Secondary | ICD-10-CM | POA: Diagnosis not present

## 2023-04-21 DIAGNOSIS — Z0000 Encounter for general adult medical examination without abnormal findings: Secondary | ICD-10-CM | POA: Diagnosis not present

## 2023-04-21 DIAGNOSIS — Z1331 Encounter for screening for depression: Secondary | ICD-10-CM | POA: Diagnosis not present

## 2023-04-21 DIAGNOSIS — Z6824 Body mass index (BMI) 24.0-24.9, adult: Secondary | ICD-10-CM | POA: Diagnosis not present

## 2023-05-04 DIAGNOSIS — N289 Disorder of kidney and ureter, unspecified: Secondary | ICD-10-CM | POA: Diagnosis not present

## 2023-05-04 DIAGNOSIS — Z6823 Body mass index (BMI) 23.0-23.9, adult: Secondary | ICD-10-CM | POA: Diagnosis not present

## 2023-05-04 DIAGNOSIS — Z23 Encounter for immunization: Secondary | ICD-10-CM | POA: Diagnosis not present

## 2023-05-05 ENCOUNTER — Other Ambulatory Visit: Payer: Self-pay | Admitting: Family Medicine

## 2023-05-05 DIAGNOSIS — N289 Disorder of kidney and ureter, unspecified: Secondary | ICD-10-CM

## 2023-05-06 ENCOUNTER — Ambulatory Visit
Admission: RE | Admit: 2023-05-06 | Discharge: 2023-05-06 | Disposition: A | Discharge status: Home / Self Care | Payer: Medicare HMO | Source: Ambulatory Visit | Attending: Family Medicine | Admitting: Family Medicine

## 2023-05-06 DIAGNOSIS — N289 Disorder of kidney and ureter, unspecified: Secondary | ICD-10-CM

## 2023-06-03 DIAGNOSIS — N289 Disorder of kidney and ureter, unspecified: Secondary | ICD-10-CM | POA: Diagnosis not present

## 2023-06-03 DIAGNOSIS — Z6823 Body mass index (BMI) 23.0-23.9, adult: Secondary | ICD-10-CM | POA: Diagnosis not present

## 2023-08-13 DIAGNOSIS — N289 Disorder of kidney and ureter, unspecified: Secondary | ICD-10-CM | POA: Diagnosis not present

## 2023-08-13 DIAGNOSIS — E785 Hyperlipidemia, unspecified: Secondary | ICD-10-CM | POA: Diagnosis not present

## 2023-08-13 DIAGNOSIS — F419 Anxiety disorder, unspecified: Secondary | ICD-10-CM | POA: Diagnosis not present

## 2023-08-13 DIAGNOSIS — I129 Hypertensive chronic kidney disease with stage 1 through stage 4 chronic kidney disease, or unspecified chronic kidney disease: Secondary | ICD-10-CM | POA: Diagnosis not present

## 2023-08-13 DIAGNOSIS — Z6822 Body mass index (BMI) 22.0-22.9, adult: Secondary | ICD-10-CM | POA: Diagnosis not present

## 2023-09-09 DIAGNOSIS — H43813 Vitreous degeneration, bilateral: Secondary | ICD-10-CM | POA: Diagnosis not present

## 2023-09-09 DIAGNOSIS — H524 Presbyopia: Secondary | ICD-10-CM | POA: Diagnosis not present

## 2023-12-06 ENCOUNTER — Observation Stay (HOSPITAL_COMMUNITY)
Admission: EM | Admit: 2023-12-06 | Discharge: 2023-12-08 | Disposition: A | Discharge status: Home / Self Care | Attending: Internal Medicine | Admitting: Internal Medicine

## 2023-12-06 ENCOUNTER — Other Ambulatory Visit: Payer: Self-pay

## 2023-12-06 ENCOUNTER — Encounter (HOSPITAL_COMMUNITY): Payer: Self-pay | Admitting: Emergency Medicine

## 2023-12-06 DIAGNOSIS — K922 Gastrointestinal hemorrhage, unspecified: Secondary | ICD-10-CM | POA: Diagnosis not present

## 2023-12-06 DIAGNOSIS — I129 Hypertensive chronic kidney disease with stage 1 through stage 4 chronic kidney disease, or unspecified chronic kidney disease: Secondary | ICD-10-CM | POA: Diagnosis not present

## 2023-12-06 DIAGNOSIS — R42 Dizziness and giddiness: Secondary | ICD-10-CM | POA: Diagnosis not present

## 2023-12-06 DIAGNOSIS — D500 Iron deficiency anemia secondary to blood loss (chronic): Secondary | ICD-10-CM | POA: Diagnosis not present

## 2023-12-06 DIAGNOSIS — K921 Melena: Secondary | ICD-10-CM | POA: Insufficient documentation

## 2023-12-06 DIAGNOSIS — K2270 Barrett's esophagus without dysplasia: Secondary | ICD-10-CM | POA: Diagnosis not present

## 2023-12-06 DIAGNOSIS — K2210 Ulcer of esophagus without bleeding: Secondary | ICD-10-CM | POA: Diagnosis not present

## 2023-12-06 DIAGNOSIS — F32A Depression, unspecified: Secondary | ICD-10-CM | POA: Diagnosis not present

## 2023-12-06 DIAGNOSIS — N1831 Chronic kidney disease, stage 3a: Secondary | ICD-10-CM | POA: Diagnosis present

## 2023-12-06 DIAGNOSIS — D509 Iron deficiency anemia, unspecified: Secondary | ICD-10-CM | POA: Diagnosis not present

## 2023-12-06 DIAGNOSIS — R197 Diarrhea, unspecified: Secondary | ICD-10-CM | POA: Diagnosis present

## 2023-12-06 DIAGNOSIS — Z79899 Other long term (current) drug therapy: Secondary | ICD-10-CM | POA: Diagnosis not present

## 2023-12-06 DIAGNOSIS — E785 Hyperlipidemia, unspecified: Secondary | ICD-10-CM | POA: Diagnosis present

## 2023-12-06 DIAGNOSIS — I491 Atrial premature depolarization: Secondary | ICD-10-CM | POA: Diagnosis not present

## 2023-12-06 DIAGNOSIS — I10 Essential (primary) hypertension: Secondary | ICD-10-CM | POA: Diagnosis present

## 2023-12-06 DIAGNOSIS — D649 Anemia, unspecified: Secondary | ICD-10-CM | POA: Insufficient documentation

## 2023-12-06 DIAGNOSIS — R231 Pallor: Secondary | ICD-10-CM | POA: Diagnosis not present

## 2023-12-06 DIAGNOSIS — I213 ST elevation (STEMI) myocardial infarction of unspecified site: Secondary | ICD-10-CM | POA: Diagnosis not present

## 2023-12-06 DIAGNOSIS — F419 Anxiety disorder, unspecified: Secondary | ICD-10-CM | POA: Diagnosis present

## 2023-12-06 DIAGNOSIS — I700 Atherosclerosis of aorta: Secondary | ICD-10-CM | POA: Insufficient documentation

## 2023-12-06 LAB — IRON AND TIBC
Iron: 64 ug/dL (ref 28–170)
Saturation Ratios: 18 % (ref 10.4–31.8)
TIBC: 364 ug/dL (ref 250–450)
UIBC: 300 ug/dL

## 2023-12-06 LAB — COMPREHENSIVE METABOLIC PANEL WITH GFR
ALT: 16 U/L (ref 0–44)
AST: 21 U/L (ref 15–41)
Albumin: 3.4 g/dL — ABNORMAL LOW (ref 3.5–5.0)
Alkaline Phosphatase: 55 U/L (ref 38–126)
Anion gap: 10 (ref 5–15)
BUN: 90 mg/dL — ABNORMAL HIGH (ref 8–23)
CO2: 20 mmol/L — ABNORMAL LOW (ref 22–32)
Calcium: 8.8 mg/dL — ABNORMAL LOW (ref 8.9–10.3)
Chloride: 106 mmol/L (ref 98–111)
Creatinine, Ser: 1.2 mg/dL — ABNORMAL HIGH (ref 0.44–1.00)
GFR, Estimated: 45 mL/min — ABNORMAL LOW (ref >60)
Glucose, Bld: 110 mg/dL — ABNORMAL HIGH (ref 70–99)
Potassium: 3.6 mmol/L (ref 3.5–5.1)
Sodium: 136 mmol/L (ref 135–145)
Total Bilirubin: 0.6 mg/dL (ref 0.0–1.2)
Total Protein: 6 g/dL — ABNORMAL LOW (ref 6.5–8.1)

## 2023-12-06 LAB — CBC WITH DIFFERENTIAL/PLATELET
Abs Immature Granulocytes: 0.02 10*3/uL (ref 0.00–0.07)
Basophils Absolute: 0 10*3/uL (ref 0.0–0.1)
Basophils Relative: 1 %
Eosinophils Absolute: 0 10*3/uL (ref 0.0–0.5)
Eosinophils Relative: 0 %
HCT: 27.5 % — ABNORMAL LOW (ref 36.0–46.0)
Hemoglobin: 8.9 g/dL — ABNORMAL LOW (ref 12.0–15.0)
Immature Granulocytes: 0 %
Lymphocytes Relative: 10 %
Lymphs Abs: 0.8 10*3/uL (ref 0.7–4.0)
MCH: 29.1 pg (ref 26.0–34.0)
MCHC: 32.4 g/dL (ref 30.0–36.0)
MCV: 89.9 fL (ref 80.0–100.0)
Monocytes Absolute: 0.5 10*3/uL (ref 0.1–1.0)
Monocytes Relative: 7 %
Neutro Abs: 6.3 10*3/uL (ref 1.7–7.7)
Neutrophils Relative %: 82 %
Platelets: 188 10*3/uL (ref 150–400)
RBC: 3.06 MIL/uL — ABNORMAL LOW (ref 3.87–5.11)
RDW: 17.1 % — ABNORMAL HIGH (ref 11.5–15.5)
WBC: 7.6 10*3/uL (ref 4.0–10.5)
nRBC: 0 % (ref 0.0–0.2)

## 2023-12-06 LAB — VITAMIN B12: Vitamin B-12: 864 pg/mL (ref 180–914)

## 2023-12-06 LAB — TYPE AND SCREEN
ABO/RH(D): A POS
Antibody Screen: NEGATIVE

## 2023-12-06 LAB — HEMOGLOBIN AND HEMATOCRIT, BLOOD
HCT: 27.1 % — ABNORMAL LOW (ref 36.0–46.0)
Hemoglobin: 8.8 g/dL — ABNORMAL LOW (ref 12.0–15.0)

## 2023-12-06 LAB — RETICULOCYTES
Immature Retic Fract: 11.8 % (ref 2.3–15.9)
RBC.: 2.93 MIL/uL — ABNORMAL LOW (ref 3.87–5.11)
Retic Count, Absolute: 51.9 10*3/uL (ref 19.0–186.0)
Retic Ct Pct: 1.8 % (ref 0.4–3.1)

## 2023-12-06 LAB — POC OCCULT BLOOD, ED: Fecal Occult Bld: POSITIVE — AB

## 2023-12-06 LAB — FOLATE: Folate: 19.3 ng/mL (ref >5.9)

## 2023-12-06 LAB — FERRITIN: Ferritin: 20 ng/mL (ref 11–307)

## 2023-12-06 MED ORDER — ONDANSETRON HCL 4 MG PO TABS: 4.0000 mg | ORAL_TABLET | Freq: Four times a day (QID) | ORAL | Status: DC | PRN

## 2023-12-06 MED ORDER — ACETAMINOPHEN 325 MG PO TABS: 650.0000 mg | ORAL_TABLET | Freq: Four times a day (QID) | ORAL | Status: DC | PRN

## 2023-12-06 MED ORDER — LACTATED RINGERS IV SOLN: INTRAVENOUS | Status: AC

## 2023-12-06 MED ORDER — VENLAFAXINE HCL ER 37.5 MG PO CP24
37.5000 mg | ORAL_CAPSULE | Freq: Every day | ORAL | Status: DC
  Administered 2023-12-06 – 2023-12-08 (×2): 37.5 mg via ORAL
  Filled 2023-12-06 (×3): qty 1

## 2023-12-06 MED ORDER — PRAVASTATIN SODIUM 40 MG PO TABS
40.0000 mg | ORAL_TABLET | Freq: Every day | ORAL | Status: DC
  Administered 2023-12-06 – 2023-12-08 (×2): 40 mg via ORAL
  Filled 2023-12-06 (×2): qty 1

## 2023-12-06 MED ORDER — ACETAMINOPHEN 650 MG RE SUPP: 650.0000 mg | Freq: Four times a day (QID) | RECTAL | Status: DC | PRN

## 2023-12-06 MED ORDER — PANTOPRAZOLE SODIUM 40 MG IV SOLR
40.0000 mg | Freq: Two times a day (BID) | INTRAVENOUS | Status: DC
  Administered 2023-12-07 – 2023-12-08 (×3): 40 mg via INTRAVENOUS
  Filled 2023-12-06 (×3): qty 10

## 2023-12-06 MED ORDER — PANTOPRAZOLE SODIUM 40 MG IV SOLR
40.0000 mg | Freq: Once | INTRAVENOUS | Status: AC
  Administered 2023-12-06: 40 mg via INTRAVENOUS
  Filled 2023-12-06: qty 10

## 2023-12-06 MED ORDER — LORATADINE 10 MG PO TABS
10.0000 mg | ORAL_TABLET | Freq: Every day | ORAL | Status: DC
  Administered 2023-12-06 – 2023-12-08 (×2): 10 mg via ORAL
  Filled 2023-12-06 (×2): qty 1

## 2023-12-06 MED ORDER — ONDANSETRON HCL 4 MG/2ML IJ SOLN: 4.0000 mg | Freq: Four times a day (QID) | INTRAMUSCULAR | Status: DC | PRN

## 2023-12-06 NOTE — H&P (Signed)
 History and Physical    Patient: Carolyn Elliott NGE:[id number] DOB: 08-05-1940 DOA: 12/06/2023 DOS: the patient was seen and examined on 12/06/2023 PCP: Darnelle Elders, PA-C  Patient coming from: Home.  Lives alone.  Independent at baseline.  Son lives close by  Chief Complaint:  Chief Complaint  Patient presents with   Diarrhea   HPI: Carolyn Elliott is a 83 y.o. female with PMH of HTN, HLD, CKD-3A, hiatal hernia, anxiety and GERD presenting with diarrhea with black stool.  Patient reports poor oral intake due to decreased appetite for the last 3 days.  However, she was able to maintain hydration.  She also felt fatigued, lightheaded and short of breath during this period.  She had loose black stool this morning about 9 AM that prompted her to call EMS.  She denies abdominal pain, nausea, vomiting, fever, chills, runny nose, sore throat, chest pain, dysuria, frequency or urgency.  Denies hematochezia.  Reports taking iron pills and Protonix .  Denies NSAID use.  Not on antiplatelet or anticoagulation.  Reports normal colonoscopy about 3 years ago.  Patient had melena, hematemesis and iron deficiency anemia in 2018.  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   Patient lives alone.  Independently ambulates at baseline.  Denies smoking cigarettes, drinking alcohol or using recreational drugs.  She is not interested in cardiopulmonary resuscitation in the event of sudden cardiopulmonary arrest.  In ED, stable vitals. Cr 1.20 (1.5 in 03/2023).  BUN 19.  Bicarb 20.  Hgb 8.9 (12.2 in 05/2023).  Hemoccult positive x 2.  [facility], Dr. Kimble Pennant consulted.  Received IV Protonix .  Admission requested for symptomatic anemia due to GI bleed.   Review of Systems: As mentioned in the history of present illness. All other systems reviewed and are negative. Past Medical History:   Diagnosis Date   Depression    GERD (gastroesophageal reflux disease)    Past Surgical History:  Procedure Laterality Date   ESOPHAGOGASTRODUODENOSCOPY (EGD) WITH PROPOFOL  N/A 05/07/2017   Procedure: ESOPHAGOGASTRODUODENOSCOPY (EGD) WITH PROPOFOL ;  Surgeon: Evangeline Hilts, MD;  Location: [facility];  Service: Endoscopy;  Laterality: N/A;   Social History:  reports that she has never smoked. She has never used smokeless tobacco. She reports current alcohol use. She reports that she does not use drugs.  No Known Allergies  Family History  Problem Relation Age of Onset   Hypertension Mother    Throat cancer Maternal Grandfather    Stomach cancer Maternal Aunt     Prior to Admission medications   Medication Sig Start Date End Date Taking? Authorizing Provider  ferrous sulfate  325 (65 FE) MG tablet Take 1 tablet (325 mg total) daily by mouth. 05/13/17 05/13/18  Twanda Gala, MD  fexofenadine (ALLEGRA) 180 MG tablet Take 180 mg daily as needed by mouth for allergies.    [provider]  pantoprazole  (PROTONIX ) 40 MG tablet Take 1 tablet (40 mg total) 2 (two) times daily by mouth. 05/13/17 06/12/17  Twanda Gala, MD  venlafaxine  XR (EFFEXOR -XR) 37.5 MG 24 hr capsule Take 37.5 mg daily by mouth. 04/02/17   [provider]    Physical Exam: Vitals:   12/06/23 1455 12/06/23 1457 12/06/23 1500 12/06/23 1600  BP:  131/75 131/71 (!) 147/79  Pulse:  83 68 75  Resp:  16 16 20   Temp:  (!) 97.5 F (36.4 C)    TempSrc:  Oral  SpO2:  99% 100% 98%  Weight: 52.6 kg     Height: 5\' 2"  (1.575 m)      GENERAL: No apparent distress.  Nontoxic. HEENT: MMM.  Vision and hearing grossly intact.  NECK: Supple.  No apparent JVD.  RESP:  No IWOB.  Fair aeration bilaterally. CVS:  RRR. Heart sounds normal.  ABD/GI/GU: BS+. Abd soft, NTND.  MSK/EXT:   No apparent deformity. Moves extremities. No edema.  SKIN: no apparent skin lesion or wound NEURO: Awake and alert.  Oriented appropriately.  No apparent focal neuro deficit. PSYCH: Calm. Normal affect.  Data Reviewed: See HPI  Assessment and Plan: Symptomatic anemia likely due to upper GI bleed: Presents with poor appetite, melena, fatigue, lightheadedness and SOB.  Hgb 8.9 from 12.2 in 03/2023.  Occult positive.  High BUN to creatinine ratio suggesting upper GIB.  Prior EGD in 2018 with large hiatal hernia, Cameron's erosion and Barrett's esophagus.  Denies NSAID use.  Not on anticoagulation. -Secure 2 peripheral IV lines -IV Protonix  40 mg twice daily -Anemia panel now, and H&H every 8 hours -Clear liquid diet and n.p.o. after midnight -Verbally consented for transfusion if indicated -[facility] to see patient in the morning  Essential hypertension: BP slightly elevated.  On amlodipine 10 mg daily at home. - Resume home amlodipine in the morning if stable from anemia standpoint  CKD-3A/azotemia: Cr was 1.5 in 03/2023.  Azotemia likely due to GI bleed. Recent Labs    12/06/23 1527  BUN 90*  CREATININE 1.20*  - Gentle IV fluid - Recheck in the morning  Hyperlipidemia - Resume home pravastatin after med rec  Barrett's esophagus/hiatal hernia - PPI as above  Anxiety - Resume home Effexor  after med rec       Advance Care Planning:   Code Status: Limited: Do not attempt resuscitation (DNR) -DNR-LIMITED -Do Not Intubate/DNI discussed with patient.  Consults: [facility], Dr. Kimble Pennant  Family Communication: None at bedside  Severity of Illness: The appropriate patient status for this patient is OBSERVATION. Observation status is judged to be reasonable and necessary in order to provide the required intensity of service to ensure the patient's safety. The patient's presenting symptoms, physical exam findings, and initial radiographic and laboratory data in the context of their medical condition is felt to place them at decreased risk for further clinical deterioration. Furthermore, it is  anticipated that the patient will be medically stable for discharge from the hospital within 2 midnights of admission.   Author: Theadore Finger, MD 12/06/2023 5:48 PM  For on call review [email or web address].

## 2023-12-06 NOTE — ED Triage Notes (Signed)
 Pt bib from home by EMS. Pt c/o dark, coffee ground stool this morning at 9am. Pt also states she hasn't been hungry as much lately only drinking ensures, gatorade since thursday night. Denies N/V.  BP 114/66  HR 74 CBG 127

## 2023-12-06 NOTE — ED Provider Notes (Signed)
 [facility] EMERGENCY DEPARTMENT AT [facility] Provider Note   CSN: [id number] Arrival date & time: 12/06/23  1443     History  Chief Complaint  Patient presents with   Diarrhea    Carolyn Elliott is a 83 y.o. female.  Patient is an 83 year old female with PMH HTN and GERD presenting to the emergency department with decreased appetite and black stool. Patient states she hasn't had an appetite for the past 3 days but is still drinking fluids. She states this morning she had an episode of diarrhea that was black, coffee ground looking stool. She denies associated abdominal pain, nausea or vomiting, dysuria or hematuria. She denies fever, cough, congestion, sore throat or body aches. Denies any recent antibiotic use or travel. Denies NSAID use or blood thinners. States she has felt lightheaded with mild shortness of breath, denies chest pain.   The history is provided by the patient.  Diarrhea      Home Medications Prior to Admission medications   Medication Sig Start Date End Date Taking? Authorizing Provider  ferrous sulfate  325 (65 FE) MG tablet Take 1 tablet (325 mg total) daily by mouth. 05/13/17 05/13/18  Twanda Gala, MD  fexofenadine (ALLEGRA) 180 MG tablet Take 180 mg daily as needed by mouth for allergies.    [provider]  pantoprazole  (PROTONIX ) 40 MG tablet Take 1 tablet (40 mg total) 2 (two) times daily by mouth. 05/13/17 06/12/17  Twanda Gala, MD  venlafaxine  XR (EFFEXOR -XR) 37.5 MG 24 hr capsule Take 37.5 mg daily by mouth. 04/02/17   [provider]      Allergies    Patient has no known allergies.    Review of Systems   Review of Systems  Gastrointestinal:  Positive for diarrhea.    Physical Exam Updated Vital Signs BP 131/71   Pulse 68   Temp (!) 97.5 F (36.4 C) (Oral)   Resp 16   Ht 5\' 2"  (1.575 m)   Wt 52.6 kg   SpO2 100%   BMI 21.22 kg/m  Physical Exam Vitals and nursing note reviewed. Exam conducted  with a chaperone present (Hailey Sirmons, RN).  Constitutional:      General: She is not in acute distress.    Appearance: Normal appearance.  HENT:     Head: Normocephalic and atraumatic.     Nose: Nose normal.     Mouth/Throat:     Mouth: Mucous membranes are moist.     Pharynx: Oropharynx is clear.  Eyes:     Extraocular Movements: Extraocular movements intact.     Conjunctiva/sclera: Conjunctivae normal.  Cardiovascular:     Rate and Rhythm: Normal rate and regular rhythm.     Heart sounds: Normal heart sounds.  Pulmonary:     Effort: Pulmonary effort is normal.     Breath sounds: Normal breath sounds.  Abdominal:     General: Abdomen is flat.     Palpations: Abdomen is soft.     Tenderness: There is no abdominal tenderness.  Genitourinary:    Rectum: Guaiac result positive.     Comments: Dark stool in the vault, no active bleeding, no visible hemorrhoids or fissures  Musculoskeletal:        General: Normal range of motion.     Cervical back: Normal range of motion.  Skin:    General: Skin is warm and dry.  Neurological:     General: No focal deficit present.     Mental Status: She  is alert and oriented to person, place, and time.  Psychiatric:        Mood and Affect: Mood normal.        Behavior: Behavior normal.     ED Results / Procedures / Treatments   Labs (all labs ordered are listed, but only abnormal results are displayed) Labs Reviewed  COMPREHENSIVE METABOLIC PANEL WITH GFR - Abnormal; Notable for the following components:      Result Value   CO2 20 (*)    Glucose, Bld 110 (*)    BUN 90 (*)    Creatinine, Ser 1.20 (*)    Calcium 8.8 (*)    Total Protein 6.0 (*)    Albumin 3.4 (*)    GFR, Estimated 45 (*)    All other components within normal limits  CBC WITH DIFFERENTIAL/PLATELET - Abnormal; Notable for the following components:   RBC 3.06 (*)    Hemoglobin 8.9 (*)    HCT 27.5 (*)    RDW 17.1 (*)    All other components within normal limits   POC OCCULT BLOOD, ED - Abnormal; Notable for the following components:   Fecal Occult Bld POSITIVE (*)    All other components within normal limits  TYPE AND SCREEN    EKG EKG Interpretation Date/Time:  Sunday December 06 2023 15:45:48 EDT Ventricular Rate:  77 PR Interval:  148 QRS Duration:  87 QT Interval:  373 QTC Calculation: 423 R Axis:   62  Text Interpretation: Sinus rhythm Supraventricular bigeminy Probable LVH with secondary repol abnrm Since last tracing of earlier today No significant change was found Confirmed by Celesta Coke ([id number]) on 12/06/2023 3:56:25 PM  Radiology No results found.  Procedures Procedures    Medications Ordered in ED Medications  pantoprazole  (PROTONIX ) injection 40 mg (40 mg Intravenous Given 12/06/23 1627)    ED Course/ Medical Decision Making/ A&P Clinical Course as of 12/06/23 1641  Sun Dec 06, 2023  1555 Hemoccult positive, Hgb 8.9 from baseline ~12 in Nov 2024. [VK]  1559 Cr at baseline. With patient's age and worsening anemia will need admission. Will plan to discuss with GI. Patient follows with Dr. Kimble Pennant with [facility]. Last endoscopy 2018 with hiatal hernia, esophagitis and suspected Barrett's esophagus. Will be given PPI. [VK]  1640 I spoke with Dr. Kimble Pennant with GI who recommended hospitalist admission and will see her in the morning. [VK]    Clinical Course User Index [VK] Kingsley, Charisa Twitty K, DO                                 Medical Decision Making This patient presents to the ED with chief complaint(s) of decreased appetite, black stools with pertinent past medical history of GERD, HTN which further complicates the presenting complaint. The complaint involves an extensive differential diagnosis and also carries with it a high risk of complications and morbidity.    The differential diagnosis includes dehydration, electrolyte derangement, viral syndrome, GI bleed, anemia, coagulopathy, ACS, arrhythmia    Additional history  obtained: Additional history obtained from N/A Records reviewed endoscopy records  ED Course and Reassessment: On patient's arrival she is hemodynamically stable in no acute distress. Will have labs and hemoccult performed. She is not having any pain or nausea at this time.   Independent labs interpretation:  The following labs were independently interpreted: Hgb 8.9 from baseline ~12, hemoccult positive stools, otherwise labs at baseline  Independent visualization  of imaging: - N/A  Consultation: - Consulted or discussed management/test interpretation w/ external professional: GI, hospitalist  Consideration for admission or further workup: patient requires admission for anemia in the setting GIB Social Determinants of health: N/A    Amount and/or Complexity of Data Reviewed Labs: ordered.  Risk Prescription drug management. Decision regarding hospitalization.          Final Clinical Impression(s) / ED Diagnoses Final diagnoses:  Gastrointestinal hemorrhage, unspecified gastrointestinal hemorrhage type  Symptomatic anemia    Rx / DC Orders ED Discharge Orders     None         Kingsley, Farran Amsden K, DO 12/06/23 1641

## 2023-12-07 DIAGNOSIS — K449 Diaphragmatic hernia without obstruction or gangrene: Secondary | ICD-10-CM | POA: Diagnosis not present

## 2023-12-07 DIAGNOSIS — K922 Gastrointestinal hemorrhage, unspecified: Secondary | ICD-10-CM | POA: Diagnosis not present

## 2023-12-07 DIAGNOSIS — K920 Hematemesis: Secondary | ICD-10-CM | POA: Diagnosis not present

## 2023-12-07 LAB — HEMOGLOBIN AND HEMATOCRIT, BLOOD
HCT: 23.6 % — ABNORMAL LOW (ref 36.0–46.0)
Hemoglobin: 7.7 g/dL — ABNORMAL LOW (ref 12.0–15.0)

## 2023-12-07 NOTE — Care Management Obs Status (Signed)
 MEDICARE OBSERVATION STATUS NOTIFICATION   Patient Details  Name: Carolyn Elliott MRN: [id number] Date of Birth: 25-Sep-1940   Medicare Observation Status Notification Given:  Yes    Marty Sleet, LCSW 12/07/2023, 2:07 PM

## 2023-12-07 NOTE — Consult Note (Signed)
 [facility] Consult  Referring Provider: [facility] Primary Care Physician:  Darnelle Elders, PA-C Primary Gastroenterologist: Dr. Kimble Pennant  Reason for Consultation: Black diarrhea  HPI: Carolyn Elliott is a 83 y.o. female with history of large hiatal hernia presented to the ER with 2 episode of black liquid diarrhea and decreased appetite. On presentation she was found to have hemoglobin of 8.9.  Patient states she takes pantoprazole  40 mg twice a day for good control of acid reflux and heartburn. She has decreased appetite and unintentional weight loss of 30 pounds over the last several months. She denies noticing blood in stool. She denies abdominal pain. Denies difficulty swallowing or pain on swallowing. She denies use of aspirin, NSAIDs, antiplatelets or Goody powders.  Virtual colonoscopy 1/24, screening: No evidence of colonic polyp or lesion, large hiatal hernia occupying 75% of stomach, colonic diverticulosis  Past Medical History:  Diagnosis Date   Depression    GERD (gastroesophageal reflux disease)     Past Surgical History:  Procedure Laterality Date   ESOPHAGOGASTRODUODENOSCOPY (EGD) WITH PROPOFOL  N/A 05/07/2017   Procedure: ESOPHAGOGASTRODUODENOSCOPY (EGD) WITH PROPOFOL ;  Surgeon: Evangeline Hilts, MD;  Location: [facility];  Service: Endoscopy;  Laterality: N/A;    Prior to Admission medications   Medication Sig Start Date End Date Taking? Authorizing Provider  amLODipine (NORVASC) 10 MG tablet Take 5 mg by mouth daily.   Yes [provider]  ferrous sulfate  325 (65 FE) MG tablet Take 1 tablet (325 mg total) daily by mouth. 05/13/17 12/06/23 Yes Twanda Gala, MD  fexofenadine (ALLEGRA) 180 MG tablet Take 180 mg daily as needed by mouth for allergies.   Yes [provider]  pantoprazole  (PROTONIX ) 40 MG tablet Take 1 tablet (40 mg total) 2 (two) times daily by mouth. Patient taking differently: Take 40 mg by mouth daily.  05/13/17 12/06/23 Yes Twanda Gala, MD  pravastatin (PRAVACHOL) 40 MG tablet Take 40 mg by mouth daily.   Yes [provider]  venlafaxine  XR (EFFEXOR -XR) 37.5 MG 24 hr capsule Take 37.5 mg daily by mouth. 04/02/17  Yes [provider]    Current Facility-Administered Medications  Medication Dose Route Frequency Provider Last Rate Last Admin   acetaminophen (TYLENOL) tablet 650 mg  650 mg Oral Q6H PRN Gonfa, Taye T, MD       Or   acetaminophen (TYLENOL) suppository 650 mg  650 mg Rectal Q6H PRN Gonfa, Taye T, MD       lactated ringers infusion   Intravenous Continuous Gonfa, Taye T, MD 75 mL/hr at 12/06/23 1826 New Bag at 12/06/23 1826   loratadine (CLARITIN) tablet 10 mg  10 mg Oral Daily Gonfa, Taye T, MD   10 mg at 12/06/23 2010   ondansetron  (ZOFRAN ) tablet 4 mg  4 mg Oral Q6H PRN Gonfa, Taye T, MD       Or   ondansetron  (ZOFRAN ) injection 4 mg  4 mg Intravenous Q6H PRN Gonfa, Taye T, MD       pantoprazole  (PROTONIX ) injection 40 mg  40 mg Intravenous Q12H Gonfa, Taye T, MD   40 mg at 12/07/23 1012   pravastatin (PRAVACHOL) tablet 40 mg  40 mg Oral Daily Gonfa, Taye T, MD   40 mg at 12/06/23 2010   venlafaxine  XR (EFFEXOR -XR) 24 hr capsule 37.5 mg  37.5 mg Oral Daily Gonfa, Taye T, MD   37.5 mg at 12/06/23 2010    Allergies as of 12/06/2023 - Review Complete 12/06/2023  Allergen Reaction Noted  Nsaids Other (See Comments) 12/06/2023    Family History  Problem Relation Age of Onset   Hypertension Mother    Throat cancer Maternal Grandfather    Stomach cancer Maternal Aunt     Social History   Socioeconomic History   Marital status: Unknown    Spouse name: Not on file   Number of children: Not on file   Years of education: Not on file   Highest education level: Not on file  Occupational History   Not on file  Tobacco Use   Smoking status: Never   Smokeless tobacco: Never  Vaping Use   Vaping status: Never Used  Substance and Sexual Activity    Alcohol use: Yes    Comment: Occasional glass of wine   Drug use: No   Sexual activity: Not on file  Other Topics Concern   Not on file  Social History Narrative   Not on file   Social Drivers of Health   Financial Resource Strain: Not on file  Food Insecurity: No Food Insecurity (12/06/2023)   Hunger Vital Sign    Worried About Running Out of Food in the Last Year: Never true    Ran Out of Food in the Last Year: Never true  Transportation Needs: No Transportation Needs (12/06/2023)   PRAPARE - Administrator, Civil Service (Medical): No    Lack of Transportation (Non-Medical): No  Physical Activity: Not on file  Stress: Not on file  Social Connections: Unknown (12/06/2023)   Social Connection and Isolation Panel [NHANES]    Frequency of Communication with Friends and Family: Three times a week    Frequency of Social Gatherings with Friends and Family: More than three times a week    Attends Religious Services: Patient unable to answer    Active Member of Clubs or Organizations: Patient declined    Attends Banker Meetings: Patient declined    Marital Status: Patient declined  Intimate Partner Violence: Not At Risk (12/06/2023)   Humiliation, Afraid, Rape, and Kick questionnaire    Fear of Current or Ex-Partner: No    Emotionally Abused: No    Physically Abused: No    Sexually Abused: No    Review of Systems: As per HPI  Physical Exam: Vital signs in last 24 hours: Temp:  [97.5 F (36.4 C)-98.4 F (36.9 C)] 98.3 F (36.8 C) (06/09 1256) Pulse Rate:  [59-99] 71 (06/09 1256) Resp:  [16-20] 18 (06/09 1256) BP: (119-147)/(54-82) 142/61 (06/09 1256) SpO2:  [98 %-100 %] 100 % (06/09 1256) Weight:  [52.6 kg] 52.6 kg (06/08 1455) Last BM Date : 12/06/23  General:   Alert,  Well-developed, well-nourished, pleasant and cooperative in NAD Head:  Normocephalic and atraumatic. Eyes: Mild pallor Ears:  Normal auditory acuity. Nose:  No deformity, discharge,   or lesions. Mouth:  No deformity or lesions.  Oropharynx pink & moist. Neck:  Supple; no masses or thyromegaly. Lungs:  Clear throughout to auscultation.   No wheezes, crackles, or rhonchi. No acute distress. Heart:  Regular rate and rhythm; no murmurs, clicks, rubs,  or gallops. Extremities:  Without clubbing or edema. Neurologic:  Alert and  oriented x4;  grossly normal neurologically. Skin:  Intact without significant lesions or rashes. Psych:  Alert and cooperative. Normal mood and affect. Abdomen:  Soft, nontender and nondistended. No masses, hepatosplenomegaly or hernias noted. Normal bowel sounds, without guarding, and without rebound.         Lab Results: Recent Labs  12/06/23 1527 12/06/23 1828 12/07/23 0733  WBC 7.6  --   --   HGB 8.9* 8.8* 7.7*  HCT 27.5* 27.1* 23.6*  PLT 188  --   --    BMET Recent Labs    12/06/23 1527  NA 136  K 3.6  CL 106  CO2 20*  GLUCOSE 110*  BUN 90*  CREATININE 1.20*  CALCIUM 8.8*   LFT Recent Labs    12/06/23 1527  PROT 6.0*  ALBUMIN 3.4*  AST 21  ALT 16  ALKPHOS 55  BILITOT 0.6   PT/INR No results for input(s): "LABPROT", "INR" in the last 72 hours.  Studies/Results: No results found.  Impression: Black diarrhea/melena, anemia, FOBT positive  Elevated BUN of 90 with a creatinine of 1.29 compatible with upper GI bleeding Normal iron panel and ferritin and folate and vitamin B12 Has not needed blood transfusion  EGD from 2018 for coffee-ground emesis and melena, Dr. 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 postmidnight for EGD in a.m.Aaron Aas Continue pantoprazole  40 mg every 12 hours.   LOS: 0 days   Genell Ken, MD  12/07/2023, 1:22 PM

## 2023-12-07 NOTE — Plan of Care (Signed)
  Problem: Education: Goal: Knowledge of General Education information will improve Description: Including pain rating scale, medication(s)/side effects and non-pharmacologic comfort measures Outcome: Progressing   Problem: Clinical Measurements: Goal: Will remain free from infection Outcome: Progressing   Problem: Elimination: Goal: Will not experience complications related to bowel motility Outcome: Progressing Goal: Will not experience complications related to urinary retention Outcome: Progressing   Problem: Safety: Goal: Ability to remain free from injury will improve Outcome: Progressing   Problem: Skin Integrity: Goal: Risk for impaired skin integrity will decrease Outcome: Progressing   

## 2023-12-07 NOTE — Progress Notes (Signed)
 PROGRESS NOTE    Carolyn Elliott  NWG:[id number] DOB: [birth year] DOA: 12/06/2023 PCP: Darnelle Elders, PA-C    Brief Narrative:  83 year old with history of hypertension, hyperlipidemia, stage IIIa CKD, history of hiatal hernia and previous GI bleeding requiring multiple transfusions with Donelda Fujita lesions presented with about 3 days of decreased appetite, diarrhea and black stool.  Denies any NSAID use.  Lives independent at home.  In the emergency room hemodynamically stable.  Hemoglobin 8.9 with recent hemoglobin of 12.2.  Started on IV Protonix  and admitted to the hospital.  Subjective: Patient seen and examined.  Denies any complaints.  She does not want to stay in the hospital.  She was asking whether she can go home.  She wants to eat.  Denies any abdomen pain nausea vomiting. Assessment & Plan:   Symptomatic blood loss anemia likely due to upper GI bleeding: With history of prior upper GI bleeding. Baseline hemoglobin 12.2-8.9-7.7 today.  Currently hemodynamically stable.  On maintenance IV fluids.  NPO.  Protonix  IV twice daily.  Will continue. Close monitoring hemoglobin every 12 hours.  Transfusion for less than 7 or evidence of active bleeding. Patient will likely need upper GI endoscopy, communicating with gastroenterology.  Essential hypertension: Blood pressure stable on amlodipine.  CKD stage IIIa: At about baseline.  Close monitoring required.  Recheck level tomorrow morning.  Anxiety: On Effexor .  Will resume.   DVT prophylaxis: SCDs Start: 12/06/23 1718   Code Status: DNR with limited intervention Family Communication: None at the bedside Disposition Plan: Status is: Observation The patient will require care spanning > 2 midnights and should be moved to inpatient because: Significant blood loss anemia, inpatient procedures     Consultants:  Gastroenterology  Procedures:  None  Antimicrobials:  None     Objective: Vitals:   12/06/23 1852 12/06/23 2238  12/07/23 0316 12/07/23 0708  BP: 119/82 137/63 (!) 119/57 (!) 136/54  Pulse: 99 66 65 (!) 59  Resp: 18 18 18 18   Temp: 98.4 F (36.9 C) 98.2 F (36.8 C) 97.9 F (36.6 C) 97.8 F (36.6 C)  TempSrc: Oral Oral Oral Oral  SpO2: 100% 99% 99% 100%  Weight:      Height:       No intake or output data in the 24 hours ending 12/07/23 1152 Filed Weights   12/06/23 1455  Weight: 52.6 kg    Examination:  General: Looks fairly comfortable.  Pale. Cardiovascular: S1-S2 normal.  Regular rate rhythm. Respiratory: Bilateral clear.  No added sounds. Gastrointestinal: Soft nontender bowel sound present. Ext: No swelling or edema.  No cyanosis. Neuro: Alert awake oriented. Musculoskeletal: No deformities.      Data Reviewed: I have personally reviewed following labs and imaging studies  CBC: Recent Labs  Lab 12/06/23 1527 12/06/23 1828 12/07/23 0733  WBC 7.6  --   --   NEUTROABS 6.3  --   --   HGB 8.9* 8.8* 7.7*  HCT 27.5* 27.1* 23.6*  MCV 89.9  --   --   PLT 188  --   --    Basic Metabolic Panel: Recent Labs  Lab 12/06/23 1527  NA 136  K 3.6  CL 106  CO2 20*  GLUCOSE 110*  BUN 90*  CREATININE 1.20*  CALCIUM 8.8*   GFR: Estimated Creatinine Clearance: 29.1 mL/min (A) (by C-G formula based on SCr of 1.2 mg/dL (H)). Liver Function Tests: Recent Labs  Lab 12/06/23 1527  AST 21  ALT 16  ALKPHOS 55  BILITOT  0.6  PROT 6.0*  ALBUMIN 3.4*   No results for input(s): "LIPASE", "AMYLASE" in the last 168 hours. No results for input(s): "AMMONIA" in the last 168 hours. Coagulation Profile: No results for input(s): "INR", "PROTIME" in the last 168 hours. Cardiac Enzymes: No results for input(s): "CKTOTAL", "CKMB", "CKMBINDEX", "TROPONINI" in the last 168 hours. BNP (last 3 results) No results for input(s): "PROBNP" in the last 8760 hours. HbA1C: No results for input(s): "HGBA1C" in the last 72 hours. CBG: No results for input(s): "GLUCAP" in the last 168  hours. Lipid Profile: No results for input(s): "CHOL", "HDL", "LDLCALC", "TRIG", "CHOLHDL", "LDLDIRECT" in the last 72 hours. Thyroid  Function Tests: No results for input(s): "TSH", "T4TOTAL", "FREET4", "T3FREE", "THYROIDAB" in the last 72 hours. Anemia Panel: Recent Labs    12/06/23 1828  VITAMINB12 864  FOLATE 19.3  FERRITIN 20  TIBC 364  IRON 64  RETICCTPCT 1.8   Sepsis Labs: No results for input(s): "PROCALCITON", "LATICACIDVEN" in the last 168 hours.  No results found for this or any previous visit (from the past 240 hours).       Radiology Studies: No results found.      Scheduled Meds:  loratadine  10 mg Oral Daily   pantoprazole  (PROTONIX ) IV  40 mg Intravenous Q12H   pravastatin  40 mg Oral Daily   venlafaxine  XR  37.5 mg Oral Daily   Continuous Infusions:  lactated ringers 75 mL/hr at 12/06/23 1826     LOS: 0 days    Time spent: 51 minutes    Vada Garibaldi, MD [facility]

## 2023-12-07 NOTE — Progress Notes (Signed)
   12/07/23 1408  TOC Brief Assessment  Insurance and Status Reviewed  Patient has primary care physician Yes  Home environment has been reviewed Single family home  Prior level of function: Independent  Prior/Current Home Services No current home services  Social Drivers of Health Review SDOH reviewed no interventions necessary  Readmission risk has been reviewed Yes  Transition of care needs no transition of care needs at this time

## 2023-12-07 NOTE — H&P (View-Only) (Signed)
 [facility] Consult  Referring Provider: [facility] hospital[facility] Primary Care Physician:  Darnelle Elders, PA-C Primary Gastroenterologist: Dr. Kimble Pennant  Reason for Consultation: Black diarrhea  HPI: Carolyn Elliott is a 83 y.o. female with history of large hiatal hernia presented to the ER with 2 episode of black liquid diarrhea and decreased appetite. On presentation she was found to have hemoglobin of 8.9.  Patient states she takes pantoprazole  40 mg twice a day for good control of acid reflux and heartburn. She has decreased appetite and unintentional weight loss of 30 pounds over the last several months. She denies noticing blood in stool. She denies abdominal pain. Denies difficulty swallowing or pain on swallowing. She denies use of aspirin, NSAIDs, antiplatelets or Goody powders.  Virtual colonoscopy 1/24, screening: No evidence of colonic polyp or lesion, large hiatal hernia occupying 75% of stomach, colonic diverticulosis  Past Medical History:  Diagnosis Date   Depression    GERD (gastroesophageal reflux disease)     Past Surgical History:  Procedure Laterality Date   ESOPHAGOGASTRODUODENOSCOPY (EGD) WITH PROPOFOL  N/A 05/07/2017   Procedure: ESOPHAGOGASTRODUODENOSCOPY (EGD) WITH PROPOFOL ;  Surgeon: Evangeline Hilts, MD;  Location: [facility];  Service: Endoscopy;  Laterality: N/A;    Prior to Admission medications   Medication Sig Start Date End Date Taking? Authorizing Provider  amLODipine (NORVASC) 10 MG tablet Take 5 mg by mouth daily.   Yes [provider]  ferrous sulfate  325 (65 FE) MG tablet Take 1 tablet (325 mg total) daily by mouth. 05/13/17 12/06/23 Yes Twanda Gala, MD  fexofenadine (ALLEGRA) 180 MG tablet Take 180 mg daily as needed by mouth for allergies.   Yes [provider]  pantoprazole  (PROTONIX ) 40 MG tablet Take 1 tablet (40 mg total) 2 (two) times daily by mouth. Patient taking differently: Take 40 mg by mouth daily.  05/13/17 12/06/23 Yes Twanda Gala, MD  pravastatin (PRAVACHOL) 40 MG tablet Take 40 mg by mouth daily.   Yes [provider]  venlafaxine  XR (EFFEXOR -XR) 37.5 MG 24 hr capsule Take 37.5 mg daily by mouth. 04/02/17  Yes [provider]    Current Facility-Administered Medications  Medication Dose Route Frequency Provider Last Rate Last Admin   acetaminophen (TYLENOL) tablet 650 mg  650 mg Oral Q6H PRN Gonfa, Taye T, MD       Or   acetaminophen (TYLENOL) suppository 650 mg  650 mg Rectal Q6H PRN Gonfa, Taye T, MD       lactated ringers infusion   Intravenous Continuous Gonfa, Taye T, MD 75 mL/hr at 12/06/23 1826 New Bag at 12/06/23 1826   loratadine (CLARITIN) tablet 10 mg  10 mg Oral Daily Gonfa, Taye T, MD   10 mg at 12/06/23 2010   ondansetron  (ZOFRAN ) tablet 4 mg  4 mg Oral Q6H PRN Gonfa, Taye T, MD       Or   ondansetron  (ZOFRAN ) injection 4 mg  4 mg Intravenous Q6H PRN Gonfa, Taye T, MD       pantoprazole  (PROTONIX ) injection 40 mg  40 mg Intravenous Q12H Gonfa, Taye T, MD   40 mg at 12/07/23 1012   pravastatin (PRAVACHOL) tablet 40 mg  40 mg Oral Daily Gonfa, Taye T, MD   40 mg at 12/06/23 2010   venlafaxine  XR (EFFEXOR -XR) 24 hr capsule 37.5 mg  37.5 mg Oral Daily Gonfa, Taye T, MD   37.5 mg at 12/06/23 2010    Allergies as of 12/06/2023 - Review Complete 12/06/2023  Allergen Reaction Noted  Nsaids Other (See Comments) 12/06/2023    Family History  Problem Relation Age of Onset   Hypertension Mother    Throat cancer Maternal Grandfather    Stomach cancer Maternal Aunt     Social History   Socioeconomic History   Marital status: Unknown    Spouse name: Not on file   Number of children: Not on file   Years of education: Not on file   Highest education level: Not on file  Occupational History   Not on file  Tobacco Use   Smoking status: Never   Smokeless tobacco: Never  Vaping Use   Vaping status: Never Used  Substance and Sexual Activity    Alcohol use: Yes    Comment: Occasional glass of wine   Drug use: No   Sexual activity: Not on file  Other Topics Concern   Not on file  Social History Narrative   Not on file   Social Drivers of Health   Financial Resource Strain: Not on file  Food Insecurity: No Food Insecurity (12/06/2023)   Hunger Vital Sign    Worried About Running Out of Food in the Last Year: Never true    Ran Out of Food in the Last Year: Never true  Transportation Needs: No Transportation Needs (12/06/2023)   PRAPARE - Administrator, Civil Service (Medical): No    Lack of Transportation (Non-Medical): No  Physical Activity: Not on file  Stress: Not on file  Social Connections: Unknown (12/06/2023)   Social Connection and Isolation Panel [NHANES]    Frequency of Communication with Friends and Family: Three times a week    Frequency of Social Gatherings with Friends and Family: More than three times a week    Attends Religious Services: Patient unable to answer    Active Member of Clubs or Organizations: Patient declined    Attends Banker Meetings: Patient declined    Marital Status: Patient declined  Intimate Partner Violence: Not At Risk (12/06/2023)   Humiliation, Afraid, Rape, and Kick questionnaire    Fear of Current or Ex-Partner: No    Emotionally Abused: No    Physically Abused: No    Sexually Abused: No    Review of Systems: As per HPI  Physical Exam: Vital signs in last 24 hours: Temp:  [97.5 F (36.4 C)-98.4 F (36.9 C)] 98.3 F (36.8 C) (06/09 1256) Pulse Rate:  [59-99] 71 (06/09 1256) Resp:  [16-20] 18 (06/09 1256) BP: (119-147)/(54-82) 142/61 (06/09 1256) SpO2:  [98 %-100 %] 100 % (06/09 1256) Weight:  [52.6 kg] 52.6 kg (06/08 1455) Last BM Date : 12/06/23  General:   Alert,  Well-developed, well-nourished, pleasant and cooperative in NAD Head:  Normocephalic and atraumatic. Eyes: Mild pallor Ears:  Normal auditory acuity. Nose:  No deformity, discharge,   or lesions. Mouth:  No deformity or lesions.  Oropharynx pink & moist. Neck:  Supple; no masses or thyromegaly. Lungs:  Clear throughout to auscultation.   No wheezes, crackles, or rhonchi. No acute distress. Heart:  Regular rate and rhythm; no murmurs, clicks, rubs,  or gallops. Extremities:  Without clubbing or edema. Neurologic:  Alert and  oriented x4;  grossly normal neurologically. Skin:  Intact without significant lesions or rashes. Psych:  Alert and cooperative. Normal mood and affect. Abdomen:  Soft, nontender and nondistended. No masses, hepatosplenomegaly or hernias noted. Normal bowel sounds, without guarding, and without rebound.         Lab Results: Recent Labs  12/06/23 1527 12/06/23 1828 12/07/23 0733  WBC 7.6  --   --   HGB 8.9* 8.8* 7.7*  HCT 27.5* 27.1* 23.6*  PLT 188  --   --    BMET Recent Labs    12/06/23 1527  NA 136  K 3.6  CL 106  CO2 20*  GLUCOSE 110*  BUN 90*  CREATININE 1.20*  CALCIUM 8.8*   LFT Recent Labs    12/06/23 1527  PROT 6.0*  ALBUMIN 3.4*  AST 21  ALT 16  ALKPHOS 55  BILITOT 0.6   PT/INR No results for input(s): "LABPROT", "INR" in the last 72 hours.  Studies/Results: No results found.  Impression: Black diarrhea/melena, anemia, FOBT positive  Elevated BUN of 90 with a creatinine of 1.29 compatible with upper GI bleeding Normal iron panel and ferritin and folate and vitamin B12 Has not needed blood transfusion  EGD from 2018 for coffee-ground emesis and melena, Dr. 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 postmidnight for EGD in a.m.Aaron Aas Continue pantoprazole  40 mg every 12 hours.   LOS: 0 days   Genell Ken, MD  12/07/2023, 1:22 PM

## 2023-12-07 NOTE — Anesthesia Preprocedure Evaluation (Signed)
 Anesthesia Evaluation  Patient identified by MRN, date of birth, ID band Patient awake    Reviewed: Allergy & Precautions, NPO status , Patient's Chart, lab work & pertinent test results  History of Anesthesia Complications Negative for: history of anesthetic complications  Airway Mallampati: II  TM Distance: >3 FB Neck ROM: Full    Dental no notable dental hx. (+) Partial Upper, Teeth Intact   Pulmonary neg pulmonary ROS   Pulmonary exam normal breath sounds clear to auscultation       Cardiovascular hypertension, Pt. on medications (-) angina (-) Past MI Normal cardiovascular exam Rhythm:Regular Rate:Normal     Neuro/Psych  PSYCHIATRIC DISORDERS Anxiety Depression       GI/Hepatic ,GERD  Medicated and Controlled,,  Endo/Other    Renal/GU Renal diseaseLab Results      Component                Value               Date                                   K                        3.6                 12/06/2023                CO2                      20 (L)              12/06/2023                BUN                      90 (H)              12/06/2023                CREATININE               1.20 (H)            12/06/2023                           negative genitourinary   Musculoskeletal   Abdominal   Peds  Hematology  (+) Blood dyscrasia, anemia .Lab Results      Component                Value               Date                      WBC                      7.6                 12/06/2023                HGB                      7.7 (L)             12/07/2023  HCT                      23.6 (L)            12/07/2023                MCV                      89.9                12/06/2023                PLT                      188                 12/06/2023              Anesthesia Other Findings All: Nsaids  Reproductive/Obstetrics                             Anesthesia  Physical Anesthesia Plan  ASA: 3  Anesthesia Plan: MAC   Post-op Pain Management: Minimal or no pain anticipated   Induction:   PONV Risk Score and Plan: Propofol  infusion and Treatment may vary due to age or medical condition  Airway Management Planned: Natural Airway and Nasal Cannula  Additional Equipment: None  Intra-op Plan:   Post-operative Plan:   Informed Consent: I have reviewed the patients History and Physical, chart, labs and discussed the procedure including the risks, benefits and alternatives for the proposed anesthesia with the patient or authorized representative who has indicated his/her understanding and acceptance.     Dental advisory given  Plan Discussed with: CRNA and Surgeon  Anesthesia Plan Comments:         Anesthesia Quick Evaluation

## 2023-12-08 ENCOUNTER — Other Ambulatory Visit (HOSPITAL_COMMUNITY): Payer: Self-pay

## 2023-12-08 ENCOUNTER — Encounter (HOSPITAL_COMMUNITY)
Admission: EM | Disposition: A | Discharge status: Home / Self Care | Payer: Self-pay | Source: Home / Self Care | Attending: Emergency Medicine

## 2023-12-08 ENCOUNTER — Observation Stay (HOSPITAL_COMMUNITY): Payer: Self-pay | Admitting: Anesthesiology

## 2023-12-08 ENCOUNTER — Encounter (HOSPITAL_COMMUNITY): Payer: Self-pay | Admitting: Student

## 2023-12-08 DIAGNOSIS — I129 Hypertensive chronic kidney disease with stage 1 through stage 4 chronic kidney disease, or unspecified chronic kidney disease: Secondary | ICD-10-CM

## 2023-12-08 DIAGNOSIS — K449 Diaphragmatic hernia without obstruction or gangrene: Secondary | ICD-10-CM | POA: Diagnosis not present

## 2023-12-08 DIAGNOSIS — N1831 Chronic kidney disease, stage 3a: Secondary | ICD-10-CM

## 2023-12-08 DIAGNOSIS — K2210 Ulcer of esophagus without bleeding: Secondary | ICD-10-CM | POA: Diagnosis not present

## 2023-12-08 DIAGNOSIS — K920 Hematemesis: Secondary | ICD-10-CM | POA: Diagnosis not present

## 2023-12-08 DIAGNOSIS — I700 Atherosclerosis of aorta: Secondary | ICD-10-CM

## 2023-12-08 DIAGNOSIS — K2270 Barrett's esophagus without dysplasia: Secondary | ICD-10-CM | POA: Diagnosis not present

## 2023-12-08 DIAGNOSIS — K922 Gastrointestinal hemorrhage, unspecified: Secondary | ICD-10-CM | POA: Diagnosis not present

## 2023-12-08 SURGERY — EGD (ESOPHAGOGASTRODUODENOSCOPY)
Anesthesia: Monitor Anesthesia Care

## 2023-12-08 MED ORDER — PROPOFOL 10 MG/ML IV BOLUS
INTRAVENOUS | Status: DC | PRN
Start: 2023-12-08 — End: 2023-12-08
  Administered 2023-12-08 (×5): 30 mg via INTRAVENOUS

## 2023-12-08 MED ORDER — PROPOFOL 500 MG/50ML IV EMUL
INTRAVENOUS | Status: DC | PRN
  Administered 2023-12-08: 75 ug/kg/min via INTRAVENOUS

## 2023-12-08 MED ORDER — SODIUM CHLORIDE 0.9 % IV SOLN: INTRAVENOUS | Status: DC

## 2023-12-08 MED ORDER — PANTOPRAZOLE SODIUM 40 MG PO TBEC
40.0000 mg | DELAYED_RELEASE_TABLET | Freq: Two times a day (BID) | ORAL | 3 refills | Status: AC
  Filled 2023-12-08: qty 60, 30d supply, fill #0

## 2023-12-08 MED ORDER — SODIUM CHLORIDE 0.9 % IV SOLN: INTRAVENOUS | Status: DC | PRN

## 2023-12-08 NOTE — Transfer of Care (Signed)
 Immediate Anesthesia Transfer of Care Note  Patient: Carolyn Elliott  Procedure(s) Performed: EGD (ESOPHAGOGASTRODUODENOSCOPY)  Patient Location: PACU  Anesthesia Type:MAC  Level of Consciousness: awake and alert   Airway & Oxygen Therapy: Patient Spontanous Breathing and Patient connected to nasal cannula oxygen  Post-op Assessment: Report given to RN and Post -op Vital signs reviewed and stable  Post vital signs: Reviewed  Last Vitals:  Vitals Value Taken Time  BP 103/40 12/08/23 1410  Temp    Pulse 79 12/08/23 1413  Resp 22 12/08/23 1413  SpO2 99 % 12/08/23 1413  Vitals shown include unfiled device data.  Last Pain:  Vitals:   12/08/23 1143  TempSrc: Temporal  PainSc: 0-No pain         Complications: No notable events documented.

## 2023-12-08 NOTE — Op Note (Signed)
 [facility] Patient Name: Carolyn Elliott Procedure Date: 12/08/2023 MRN: [id number] Attending MD: Genell Ken , MD, [id number] Date of Birth: [birth year] CSN: [id number] Age: 83 Admit Type: Inpatient Procedure:                Upper GI endoscopy Indications:              Iron deficiency anemia, Melena Providers:                Genell Ken, MD, Golda Latch, RN, Tyrus Gallus, Technician Referring MD:             [facility] Medicines:                Monitored Anesthesia Care Complications:            No immediate complications. Estimated blood loss:                            Minimal. Estimated Blood Loss:     Estimated blood loss was minimal. Procedure:                Pre-Anesthesia Assessment:                           - Prior to the procedure, a History and Physical                            was performed, and patient medications and                            allergies were reviewed. The patient's tolerance of                            previous anesthesia was also reviewed. The risks                            and benefits of the procedure and the sedation                            options and risks were discussed with the patient.                            All questions were answered, and informed consent                            was obtained. Prior Anticoagulants: The patient has                            taken no anticoagulant or antiplatelet agents. ASA                            Grade Assessment: III - A patient with severe  systemic disease. After reviewing the risks and                            benefits, the patient was deemed in satisfactory                            condition to undergo the procedure.                           After obtaining informed consent, the endoscope was                            passed under direct vision. Throughout the                            procedure, the  patient's blood pressure, pulse, and                            oxygen saturations were monitored continuously. The                            [id number]-H190 ([id number]) Olympus endoscope was introduced                            through the mouth, and advanced to the second part                            of duodenum. The upper GI endoscopy was                            accomplished without difficulty. The patient                            tolerated the procedure well. Scope In: Scope Out: Findings:      The esophagus and gastroesophageal junction were examined with white       light.      There were esophageal mucosal changes secondary to established       long-segment Barrett's disease.      These changes involved the mucosa at the upper extent of the gastric       folds (30 cm from the incisors) extending to the Z-line (18 cm from the       incisors).      Diffuse salmon-colored mucosa was present from 18 to 30 cm. The maximum       longitudinal extent of these esophageal mucosal changes was 12 cm in       length.      Mucosa was biopsied with a cold forceps for histology in a targeted       manner at intervals of 3 cm from 18 to 30 cm from the incisors.      A total of 4 specimen bottles were sent to pathology.      Jar 1: 27 cm tp 30 cm      Jar 2: 24 cm to 27 cm      Jar 3: 21 cm to 24 cm      Jar  4: 18 cm to 21 cm      Few linear and superficial esophageal ulcers with no bleeding and no       stigmata of recent bleeding were found. The largest lesion was 10 mm in       largest dimension.      A large hiatal hernia was present.      The incisura, gastric antrum, prepyloric region of the stomach and       pylorus were normal.      The examined duodenum was normal. Impression:               - Esophageal mucosal changes secondary to                            established long-segment Barrett's disease.                            Biopsied.                           - Esophageal  ulcers with no bleeding and no                            stigmata of recent bleeding.                           - Large hiatal hernia.                           - Normal incisura, antrum, prepyloric region of the                            stomach and pylorus.                           - Normal examined duodenum. Moderate Sedation:      Patient did not receive moderate sedation for this procedure, but       instead received monitored anesthesia care. Recommendation:           - GERD prevention diet.                           - Continue present medications.                           - Await pathology results.                           - Recommed PPI twice a day indefinitely. Procedure Code(s):        --- Professional ---                           [phone], Esophagogastroduodenoscopy, flexible,                            transoral; with biopsy, single or multiple Diagnosis Code(s):        --- Professional ---  K22.70, Barrett's esophagus without dysplasia                           K22.10, Ulcer of esophagus without bleeding                           K44.9, Diaphragmatic hernia without obstruction or                            gangrene                           D50.9, Iron deficiency anemia, unspecified                           K92.1, Melena (includes Hematochezia) CPT copyright 2022 [employer]. All rights reserved. The codes documented in this report are preliminary and upon coder review may  be revised to meet current compliance requirements. Genell Ken, MD 12/08/2023 2:12:46 PM This report has been signed electronically. Number of Addenda: 0

## 2023-12-08 NOTE — Interval H&P Note (Signed)
 History and Physical Interval Note: 83 year old female known to have large hiatal hernia with liquid black stool, anemia for an EGD with propofol .  12/08/2023 12:26 PM  Carolyn Elliott  has presented today for EGD with propofol  with the diagnosis of melena, anemia, hiatal hernia.  The various methods of treatment have been discussed with the patient and family. After consideration of risks, benefits and other options for treatment, the patient has consented to  Procedure(s): EGD (ESOPHAGOGASTRODUODENOSCOPY) (N/A) as a surgical intervention.  The patient's history has been reviewed, patient examined, no change in status, stable for surgery.  I have reviewed the patient's chart and labs.  Questions were answered to the patient's satisfaction.     Genell Ken

## 2023-12-08 NOTE — Progress Notes (Signed)
 Discharge medications delivered to bedside [facility]

## 2023-12-08 NOTE — Progress Notes (Addendum)
 AVS reviewed with patient at the bedside. All questions answered and patient verbalized understanding. IV removed per order without complications. Patient to be discharge home via vehicle. Patient escorted to main entrance via WC by staff.

## 2023-12-08 NOTE — Discharge Summary (Signed)
 Physician Discharge Summary  Carolyn Elliott ZOX:[id number] DOB: March 03, 1941 DOA: 12/06/2023  PCP: Carolyn Elders, PA-C  Admit date: 12/06/2023 Discharge date: 12/08/2023  Admitted From: Home Disposition: Home  Recommendations for Outpatient Follow-up:  Follow up with PCP in 1-2 weeks Please obtain BMP/CBC in one week Consistently take acid medication.  Home Health: N/A Equipment/Devices: N/A  Discharge Condition: Stable CODE STATUS: DNR with limited intervention Diet recommendation: Low-salt diet, reflux precautions as instructions given.  Discharge summary: 83 year old with history of hypertension, hyperlipidemia, stage IIIa CKD, history of hiatal hernia and previous GI bleeding requiring multiple transfusions with Carolyn Elliott lesions presented with about 3 days of decreased appetite, diarrhea and black stool. Denies any NSAID use. Lives independent at home. In the emergency room hemodynamically stable. Hemoglobin 8.9 with recent hemoglobin of 12.2. Started on IV Protonix  and admitted to the hospital   Symptomatic blood loss anemia due to upper GI bleeding:  With history of prior upper GI bleeding. Baseline hemoglobin 12.2-8.9-7.7 and stabilized.   Patient underwent upper GI endoscopy today and found to have esophageal ulcer but no active bleeding.  Likely related to large hiatal hernia and severe reflux disease. Patient going home with reflux prevention measures Protonix  40 mg twice daily to take all the time. GI to schedule outpatient follow-up and biopsy results. Stable for discharge. Continue all other home medications.  Discharge Diagnoses:  Principal Problem:   Upper GI bleed Active Problems:   Primary hypertension   Hyperlipidemia   Chronic kidney disease, stage 3a (HCC)   Anxiety   Barrett's esophagus   Iron deficiency anemia    Discharge Instructions  Discharge Instructions     Diet - low sodium heart healthy   Complete by: As directed    Discharge instructions    Complete by: As directed    All time reflux precautions   Increase activity slowly   Complete by: As directed       Allergies as of 12/08/2023       Reactions   Nsaids Other (See Comments)   Advised by MD to not take         Medication List     TAKE these medications    amLODipine 10 MG tablet Commonly known as: NORVASC Take 5 mg by mouth daily.   ferrous sulfate  325 (65 FE) MG tablet Take 1 tablet (325 mg total) daily by mouth.   fexofenadine 180 MG tablet Commonly known as: ALLEGRA Take 180 mg daily as needed by mouth for allergies.   pantoprazole  40 MG tablet Commonly known as: Protonix  Take 1 tablet (40 mg total) by mouth 2 (two) times daily. What changed: when to take this   pravastatin 40 MG tablet Commonly known as: PRAVACHOL Take 40 mg by mouth daily.   venlafaxine  XR 37.5 MG 24 hr capsule Commonly known as: EFFEXOR -XR Take 37.5 mg daily by mouth.        Allergies  Allergen Reactions   Nsaids Other (See Comments)    Advised by MD to not take     Consultations: Gastroenterology   Procedures/Studies: No results found. (Echo, Carotid, EGD, Colonoscopy, ERCP)    Subjective: Patient seen and examined.  No overnight events.  Eager to get procedure done and go home.  Son at the bedside. Patient came back from procedure.  Communicated by gastroenterology that she is able to go home.   Discharge Exam: Vitals:   12/08/23 1425 12/08/23 1443  BP: (!) 145/53 137/70  Pulse: 71 83  Resp: 19  18  Temp:  (!) 97.4 F (36.3 C)  SpO2: 99% 99%   Vitals:   12/08/23 1415 12/08/23 1420 12/08/23 1425 12/08/23 1443  BP:  (!) 134/59 (!) 145/53 137/70  Pulse: 83 75 71 83  Resp: (!) 21 15 19 18   Temp:    (!) 97.4 F (36.3 C)  TempSrc:    Oral  SpO2: 98% 99% 99% 99%  Weight:      Height:        General: Pt is alert, awake, not in acute distress Cardiovascular: RRR, S1/S2 +, no rubs, no gallops Respiratory: CTA bilaterally, no wheezing, no  rhonchi Abdominal: Soft, NT, ND, bowel sounds + Extremities: no edema, no cyanosis    The results of significant diagnostics from this hospitalization (including imaging, microbiology, ancillary and laboratory) are listed below for reference.     Microbiology: No results found for this or any previous visit (from the past 240 hours).   Labs: BNP (last 3 results) No results for input(s): "BNP" in the last 8760 hours. Basic Metabolic Panel: Recent Labs  Lab 12/06/23 1527  NA 136  K 3.6  CL 106  CO2 20*  GLUCOSE 110*  BUN 90*  CREATININE 1.20*  CALCIUM 8.8*   Liver Function Tests: Recent Labs  Lab 12/06/23 1527  AST 21  ALT 16  ALKPHOS 55  BILITOT 0.6  PROT 6.0*  ALBUMIN 3.4*   No results for input(s): "LIPASE", "AMYLASE" in the last 168 hours. No results for input(s): "AMMONIA" in the last 168 hours. CBC: Recent Labs  Lab 12/06/23 1527 12/06/23 1828 12/07/23 0733  WBC 7.6  --   --   NEUTROABS 6.3  --   --   HGB 8.9* 8.8* 7.7*  HCT 27.5* 27.1* 23.6*  MCV 89.9  --   --   PLT 188  --   --    Cardiac Enzymes: No results for input(s): "CKTOTAL", "CKMB", "CKMBINDEX", "TROPONINI" in the last 168 hours. BNP: Invalid input(s): "POCBNP" CBG: No results for input(s): "GLUCAP" in the last 168 hours. D-Dimer No results for input(s): "DDIMER" in the last 72 hours. Hgb A1c No results for input(s): "HGBA1C" in the last 72 hours. Lipid Profile No results for input(s): "CHOL", "HDL", "LDLCALC", "TRIG", "CHOLHDL", "LDLDIRECT" in the last 72 hours. Thyroid  function studies No results for input(s): "TSH", "T4TOTAL", "T3FREE", "THYROIDAB" in the last 72 hours.  Invalid input(s): "FREET3" Anemia work up Recent Labs    12/06/23 1828  VITAMINB12 864  FOLATE 19.3  FERRITIN 20  TIBC 364  IRON 64  RETICCTPCT 1.8   Urinalysis    Component Value Date/Time   COLORURINE YELLOW 05/06/2017 1541   APPEARANCEUR CLEAR 05/06/2017 1541   LABSPEC 1.020 05/06/2017 1541    PHURINE 6.0 05/06/2017 1541   GLUCOSEU NEGATIVE 05/06/2017 1541   HGBUR LARGE (A) 05/06/2017 1541   BILIRUBINUR NEGATIVE 05/06/2017 1541   KETONESUR 5 (A) 05/06/2017 1541   PROTEINUR NEGATIVE 05/06/2017 1541   NITRITE NEGATIVE 05/06/2017 1541   LEUKOCYTESUR NEGATIVE 05/06/2017 1541   Sepsis Labs Recent Labs  Lab 12/06/23 1527  WBC 7.6   Microbiology No results found for this or any previous visit (from the past 240 hours).   Time coordinating discharge: 32 minutes  SIGNED:   Vada Garibaldi, MD  [facility] 12/08/2023, 3:09 PM

## 2023-12-08 NOTE — Plan of Care (Signed)
 VSS. No c/o pain. LBM 6/8. No evidence of bleeding this shift. No acute events overnight.  Problem: Education: Goal: Knowledge of General Education information will improve Description: Including pain rating scale, medication(s)/side effects and non-pharmacologic comfort measures Outcome: Progressing   Problem: Clinical Measurements: Goal: Ability to maintain clinical measurements within normal limits will improve Outcome: Progressing Goal: Will remain free from infection Outcome: Progressing   Problem: Safety: Goal: Ability to remain free from injury will improve Outcome: Progressing   Problem: Education: Goal: Ability to identify signs and symptoms of gastrointestinal bleeding will improve Outcome: Progressing   Problem: Bowel/Gastric: Goal: Will show no signs and symptoms of gastrointestinal bleeding Outcome: Progressing   Problem: Fluid Volume: Goal: Will show no signs and symptoms of excessive bleeding Outcome: Progressing

## 2023-12-08 NOTE — Anesthesia Postprocedure Evaluation (Signed)
 Anesthesia Post Note  Patient: Carolyn Elliott  Procedure(s) Performed: EGD (ESOPHAGOGASTRODUODENOSCOPY)     Patient location during evaluation: Endoscopy Anesthesia Type: MAC Level of consciousness: awake and alert Pain management: pain level controlled Vital Signs Assessment: post-procedure vital signs reviewed and stable Respiratory status: spontaneous breathing, nonlabored ventilation, respiratory function stable and patient connected to nasal cannula oxygen Cardiovascular status: blood pressure returned to baseline and stable Postop Assessment: no apparent nausea or vomiting Anesthetic complications: no  No notable events documented.  Last Vitals:  Vitals:   12/08/23 1425 12/08/23 1443  BP: (!) 145/53 137/70  Pulse: 71 83  Resp: 19 18  Temp:  (!) 36.3 C  SpO2: 99% 99%    Last Pain:  Vitals:   12/08/23 1447  TempSrc:   PainSc: 0-No pain                 Rosalita Combe

## 2023-12-10 LAB — SURGICAL PATHOLOGY

## 2023-12-11 ENCOUNTER — Encounter (HOSPITAL_COMMUNITY): Payer: Self-pay | Admitting: Gastroenterology

## 2023-12-14 DIAGNOSIS — N1831 Chronic kidney disease, stage 3a: Secondary | ICD-10-CM | POA: Diagnosis not present

## 2023-12-14 DIAGNOSIS — I129 Hypertensive chronic kidney disease with stage 1 through stage 4 chronic kidney disease, or unspecified chronic kidney disease: Secondary | ICD-10-CM | POA: Diagnosis not present

## 2023-12-14 DIAGNOSIS — I10 Essential (primary) hypertension: Secondary | ICD-10-CM | POA: Diagnosis not present

## 2023-12-17 DIAGNOSIS — K449 Diaphragmatic hernia without obstruction or gangrene: Secondary | ICD-10-CM | POA: Diagnosis not present

## 2023-12-17 DIAGNOSIS — K922 Gastrointestinal hemorrhage, unspecified: Secondary | ICD-10-CM | POA: Diagnosis not present

## 2023-12-17 DIAGNOSIS — Z6822 Body mass index (BMI) 22.0-22.9, adult: Secondary | ICD-10-CM | POA: Diagnosis not present

## 2023-12-17 DIAGNOSIS — I129 Hypertensive chronic kidney disease with stage 1 through stage 4 chronic kidney disease, or unspecified chronic kidney disease: Secondary | ICD-10-CM | POA: Diagnosis not present

## 2023-12-17 DIAGNOSIS — D509 Iron deficiency anemia, unspecified: Secondary | ICD-10-CM | POA: Diagnosis not present

## 2023-12-17 DIAGNOSIS — K219 Gastro-esophageal reflux disease without esophagitis: Secondary | ICD-10-CM | POA: Diagnosis not present

## 2023-12-28 DIAGNOSIS — I129 Hypertensive chronic kidney disease with stage 1 through stage 4 chronic kidney disease, or unspecified chronic kidney disease: Secondary | ICD-10-CM | POA: Diagnosis not present

## 2023-12-28 DIAGNOSIS — I10 Essential (primary) hypertension: Secondary | ICD-10-CM | POA: Diagnosis not present

## 2023-12-28 DIAGNOSIS — N1831 Chronic kidney disease, stage 3a: Secondary | ICD-10-CM | POA: Diagnosis not present

## 2023-12-28 DIAGNOSIS — E785 Hyperlipidemia, unspecified: Secondary | ICD-10-CM | POA: Diagnosis not present

## 2023-12-29 DIAGNOSIS — R21 Rash and other nonspecific skin eruption: Secondary | ICD-10-CM | POA: Diagnosis not present

## 2023-12-29 DIAGNOSIS — L239 Allergic contact dermatitis, unspecified cause: Secondary | ICD-10-CM | POA: Diagnosis not present

## 2024-01-21 DIAGNOSIS — N289 Disorder of kidney and ureter, unspecified: Secondary | ICD-10-CM | POA: Diagnosis not present

## 2024-01-28 DIAGNOSIS — E785 Hyperlipidemia, unspecified: Secondary | ICD-10-CM | POA: Diagnosis not present

## 2024-01-28 DIAGNOSIS — N1831 Chronic kidney disease, stage 3a: Secondary | ICD-10-CM | POA: Diagnosis not present

## 2024-01-28 DIAGNOSIS — I129 Hypertensive chronic kidney disease with stage 1 through stage 4 chronic kidney disease, or unspecified chronic kidney disease: Secondary | ICD-10-CM | POA: Diagnosis not present

## 2024-01-28 DIAGNOSIS — I10 Essential (primary) hypertension: Secondary | ICD-10-CM | POA: Diagnosis not present

## 2024-02-11 DIAGNOSIS — I10 Essential (primary) hypertension: Secondary | ICD-10-CM | POA: Diagnosis not present

## 2024-02-11 DIAGNOSIS — N1831 Chronic kidney disease, stage 3a: Secondary | ICD-10-CM | POA: Diagnosis not present

## 2024-02-11 DIAGNOSIS — I129 Hypertensive chronic kidney disease with stage 1 through stage 4 chronic kidney disease, or unspecified chronic kidney disease: Secondary | ICD-10-CM | POA: Diagnosis not present

## 2024-02-16 DIAGNOSIS — D509 Iron deficiency anemia, unspecified: Secondary | ICD-10-CM | POA: Diagnosis not present

## 2024-02-16 DIAGNOSIS — I700 Atherosclerosis of aorta: Secondary | ICD-10-CM | POA: Diagnosis not present

## 2024-02-16 DIAGNOSIS — I10 Essential (primary) hypertension: Secondary | ICD-10-CM | POA: Diagnosis not present

## 2024-02-16 DIAGNOSIS — I129 Hypertensive chronic kidney disease with stage 1 through stage 4 chronic kidney disease, or unspecified chronic kidney disease: Secondary | ICD-10-CM | POA: Diagnosis not present

## 2024-02-16 DIAGNOSIS — E785 Hyperlipidemia, unspecified: Secondary | ICD-10-CM | POA: Diagnosis not present

## 2024-02-16 DIAGNOSIS — N1831 Chronic kidney disease, stage 3a: Secondary | ICD-10-CM | POA: Diagnosis not present

## 2024-02-28 DIAGNOSIS — E785 Hyperlipidemia, unspecified: Secondary | ICD-10-CM | POA: Diagnosis not present

## 2024-02-28 DIAGNOSIS — N1831 Chronic kidney disease, stage 3a: Secondary | ICD-10-CM | POA: Diagnosis not present

## 2024-02-28 DIAGNOSIS — I129 Hypertensive chronic kidney disease with stage 1 through stage 4 chronic kidney disease, or unspecified chronic kidney disease: Secondary | ICD-10-CM | POA: Diagnosis not present

## 2024-02-28 DIAGNOSIS — I10 Essential (primary) hypertension: Secondary | ICD-10-CM | POA: Diagnosis not present

## 2024-03-12 DIAGNOSIS — I10 Essential (primary) hypertension: Secondary | ICD-10-CM | POA: Diagnosis not present

## 2024-03-12 DIAGNOSIS — N1831 Chronic kidney disease, stage 3a: Secondary | ICD-10-CM | POA: Diagnosis not present

## 2024-03-12 DIAGNOSIS — I129 Hypertensive chronic kidney disease with stage 1 through stage 4 chronic kidney disease, or unspecified chronic kidney disease: Secondary | ICD-10-CM | POA: Diagnosis not present

## 2024-03-29 DIAGNOSIS — E785 Hyperlipidemia, unspecified: Secondary | ICD-10-CM | POA: Diagnosis not present

## 2024-03-29 DIAGNOSIS — I10 Essential (primary) hypertension: Secondary | ICD-10-CM | POA: Diagnosis not present

## 2024-03-29 DIAGNOSIS — I129 Hypertensive chronic kidney disease with stage 1 through stage 4 chronic kidney disease, or unspecified chronic kidney disease: Secondary | ICD-10-CM | POA: Diagnosis not present

## 2024-03-29 DIAGNOSIS — N1831 Chronic kidney disease, stage 3a: Secondary | ICD-10-CM | POA: Diagnosis not present

## 2024-04-11 DIAGNOSIS — I129 Hypertensive chronic kidney disease with stage 1 through stage 4 chronic kidney disease, or unspecified chronic kidney disease: Secondary | ICD-10-CM | POA: Diagnosis not present

## 2024-04-11 DIAGNOSIS — I10 Essential (primary) hypertension: Secondary | ICD-10-CM | POA: Diagnosis not present

## 2024-04-11 DIAGNOSIS — N1831 Chronic kidney disease, stage 3a: Secondary | ICD-10-CM | POA: Diagnosis not present

## 2024-04-25 DIAGNOSIS — Z0000 Encounter for general adult medical examination without abnormal findings: Secondary | ICD-10-CM | POA: Diagnosis not present

## 2024-04-29 DIAGNOSIS — N1831 Chronic kidney disease, stage 3a: Secondary | ICD-10-CM | POA: Diagnosis not present

## 2024-04-29 DIAGNOSIS — I129 Hypertensive chronic kidney disease with stage 1 through stage 4 chronic kidney disease, or unspecified chronic kidney disease: Secondary | ICD-10-CM | POA: Diagnosis not present

## 2024-04-29 DIAGNOSIS — I10 Essential (primary) hypertension: Secondary | ICD-10-CM | POA: Diagnosis not present

## 2024-04-29 DIAGNOSIS — E785 Hyperlipidemia, unspecified: Secondary | ICD-10-CM | POA: Diagnosis not present

## 2024-05-11 DIAGNOSIS — N1831 Chronic kidney disease, stage 3a: Secondary | ICD-10-CM | POA: Diagnosis not present

## 2024-05-11 DIAGNOSIS — I10 Essential (primary) hypertension: Secondary | ICD-10-CM | POA: Diagnosis not present

## 2024-05-11 DIAGNOSIS — I129 Hypertensive chronic kidney disease with stage 1 through stage 4 chronic kidney disease, or unspecified chronic kidney disease: Secondary | ICD-10-CM | POA: Diagnosis not present

## 2024-05-12 DIAGNOSIS — E785 Hyperlipidemia, unspecified: Secondary | ICD-10-CM | POA: Diagnosis not present

## 2024-05-12 DIAGNOSIS — K2270 Barrett's esophagus without dysplasia: Secondary | ICD-10-CM | POA: Diagnosis not present

## 2024-05-12 DIAGNOSIS — Z8249 Family history of ischemic heart disease and other diseases of the circulatory system: Secondary | ICD-10-CM | POA: Diagnosis not present

## 2024-05-12 DIAGNOSIS — N1831 Chronic kidney disease, stage 3a: Secondary | ICD-10-CM | POA: Diagnosis not present

## 2024-05-12 DIAGNOSIS — I700 Atherosclerosis of aorta: Secondary | ICD-10-CM | POA: Diagnosis not present

## 2024-05-12 DIAGNOSIS — F329 Major depressive disorder, single episode, unspecified: Secondary | ICD-10-CM | POA: Diagnosis not present

## 2024-05-12 DIAGNOSIS — I129 Hypertensive chronic kidney disease with stage 1 through stage 4 chronic kidney disease, or unspecified chronic kidney disease: Secondary | ICD-10-CM | POA: Diagnosis not present

## 2024-05-12 DIAGNOSIS — K219 Gastro-esophageal reflux disease without esophagitis: Secondary | ICD-10-CM | POA: Diagnosis not present

## 2024-05-12 DIAGNOSIS — F419 Anxiety disorder, unspecified: Secondary | ICD-10-CM | POA: Diagnosis not present

## 2024-05-17 DIAGNOSIS — I10 Essential (primary) hypertension: Secondary | ICD-10-CM | POA: Diagnosis not present

## 2024-05-17 DIAGNOSIS — F419 Anxiety disorder, unspecified: Secondary | ICD-10-CM | POA: Diagnosis not present

## 2024-05-17 DIAGNOSIS — Z6823 Body mass index (BMI) 23.0-23.9, adult: Secondary | ICD-10-CM | POA: Diagnosis not present

## 2024-05-17 DIAGNOSIS — L659 Nonscarring hair loss, unspecified: Secondary | ICD-10-CM | POA: Diagnosis not present

## 2024-05-17 DIAGNOSIS — N1831 Chronic kidney disease, stage 3a: Secondary | ICD-10-CM | POA: Diagnosis not present

## 2024-05-29 DIAGNOSIS — I129 Hypertensive chronic kidney disease with stage 1 through stage 4 chronic kidney disease, or unspecified chronic kidney disease: Secondary | ICD-10-CM | POA: Diagnosis not present

## 2024-05-29 DIAGNOSIS — E785 Hyperlipidemia, unspecified: Secondary | ICD-10-CM | POA: Diagnosis not present

## 2024-05-29 DIAGNOSIS — I10 Essential (primary) hypertension: Secondary | ICD-10-CM | POA: Diagnosis not present

## 2024-05-29 DIAGNOSIS — N1831 Chronic kidney disease, stage 3a: Secondary | ICD-10-CM | POA: Diagnosis not present
